# Patient Record
Sex: Male | Born: 1985
Health system: Southern US, Community
[De-identification: ages and names within clinical notes are randomized; demographics above are authoritative.]

## PROBLEM LIST (undated history)

## (undated) DIAGNOSIS — F419 Anxiety disorder, unspecified: Secondary | ICD-10-CM

## (undated) DIAGNOSIS — I471 Supraventricular tachycardia, unspecified: Secondary | ICD-10-CM

## (undated) DIAGNOSIS — F909 Attention-deficit hyperactivity disorder, unspecified type: Secondary | ICD-10-CM

## (undated) DIAGNOSIS — I456 Pre-excitation syndrome: Secondary | ICD-10-CM

## (undated) HISTORY — PX: TONSILLECTOMY: SUR1361

## (undated) HISTORY — PX: CARDIAC CATHETERIZATION: SHX172

## (undated) HISTORY — DX: Anxiety disorder, unspecified: F41.9

## (undated) HISTORY — DX: Attention-deficit hyperactivity disorder, unspecified type: F90.9

---
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
## 2009-09-17 DIAGNOSIS — R6882 Decreased libido: Secondary | ICD-10-CM | POA: Insufficient documentation

## 2010-05-29 ENCOUNTER — Ambulatory Visit: Payer: Self-pay | Admitting: Family Medicine

## 2010-08-23 ENCOUNTER — Emergency Department: Payer: Self-pay | Admitting: *Deleted

## 2012-04-16 ENCOUNTER — Ambulatory Visit: Payer: Self-pay | Admitting: Family Medicine

## 2013-08-07 ENCOUNTER — Ambulatory Visit: Payer: Self-pay | Admitting: Internal Medicine

## 2013-08-31 ENCOUNTER — Emergency Department: Payer: Self-pay | Admitting: Emergency Medicine

## 2013-10-29 DIAGNOSIS — I471 Supraventricular tachycardia: Secondary | ICD-10-CM | POA: Insufficient documentation

## 2013-12-26 DIAGNOSIS — F419 Anxiety disorder, unspecified: Secondary | ICD-10-CM | POA: Insufficient documentation

## 2014-08-25 ENCOUNTER — Other Ambulatory Visit: Payer: Self-pay

## 2014-08-25 ENCOUNTER — Ambulatory Visit: Payer: Self-pay | Admitting: Psychiatry

## 2014-08-25 DIAGNOSIS — F988 Other specified behavioral and emotional disorders with onset usually occurring in childhood and adolescence: Secondary | ICD-10-CM | POA: Insufficient documentation

## 2014-08-25 DIAGNOSIS — G4700 Insomnia, unspecified: Secondary | ICD-10-CM

## 2014-08-25 DIAGNOSIS — Z8709 Personal history of other diseases of the respiratory system: Secondary | ICD-10-CM | POA: Insufficient documentation

## 2014-08-25 DIAGNOSIS — F4001 Agoraphobia with panic disorder: Secondary | ICD-10-CM | POA: Insufficient documentation

## 2014-08-25 DIAGNOSIS — F411 Generalized anxiety disorder: Secondary | ICD-10-CM | POA: Insufficient documentation

## 2014-08-25 NOTE — Telephone Encounter (Signed)
fax was received requesting refill on zolpidem tartrate 10mg  take 0ne (1) at bedtime #90 mail order refill. Pt has appt for 09-02-14 , last seen on 07-23-14

## 2014-08-26 MED ORDER — ZOLPIDEM TARTRATE 10 MG PO TABS: 10.0000 mg | ORAL_TABLET | Freq: Every day | ORAL | Status: DC

## 2014-08-26 NOTE — Telephone Encounter (Signed)
left message that rx was faxed in . pt notified.

## 2014-09-02 ENCOUNTER — Ambulatory Visit: Payer: 59 | Admitting: Psychiatry

## 2014-09-02 DIAGNOSIS — R000 Tachycardia, unspecified: Secondary | ICD-10-CM | POA: Insufficient documentation

## 2014-09-02 DIAGNOSIS — M25519 Pain in unspecified shoulder: Secondary | ICD-10-CM | POA: Insufficient documentation

## 2014-09-02 DIAGNOSIS — K921 Melena: Secondary | ICD-10-CM | POA: Insufficient documentation

## 2014-09-02 DIAGNOSIS — K30 Functional dyspepsia: Secondary | ICD-10-CM | POA: Insufficient documentation

## 2014-09-02 DIAGNOSIS — F410 Panic disorder [episodic paroxysmal anxiety] without agoraphobia: Secondary | ICD-10-CM | POA: Insufficient documentation

## 2014-09-02 DIAGNOSIS — G4700 Insomnia, unspecified: Secondary | ICD-10-CM | POA: Insufficient documentation

## 2014-09-02 DIAGNOSIS — F5221 Male erectile disorder: Secondary | ICD-10-CM | POA: Insufficient documentation

## 2014-09-02 DIAGNOSIS — I456 Pre-excitation syndrome: Secondary | ICD-10-CM | POA: Insufficient documentation

## 2014-09-02 DIAGNOSIS — H9313 Tinnitus, bilateral: Secondary | ICD-10-CM | POA: Insufficient documentation

## 2014-09-02 DIAGNOSIS — M26609 Unspecified temporomandibular joint disorder, unspecified side: Secondary | ICD-10-CM | POA: Insufficient documentation

## 2014-09-04 ENCOUNTER — Ambulatory Visit (INDEPENDENT_AMBULATORY_CARE_PROVIDER_SITE_OTHER): Payer: 59 | Admitting: Psychiatry

## 2014-09-04 ENCOUNTER — Encounter: Payer: Self-pay | Admitting: Psychiatry

## 2014-09-04 VITALS — BP 122/72 | HR 90 | Temp 97.4°F | Ht 70.0 in | Wt 160.0 lb

## 2014-09-04 DIAGNOSIS — F900 Attention-deficit hyperactivity disorder, predominantly inattentive type: Secondary | ICD-10-CM

## 2014-09-04 MED ORDER — DIAZEPAM 10 MG PO TABS: 10.0000 mg | ORAL_TABLET | Freq: Every day | ORAL | Status: DC | PRN

## 2014-09-04 MED ORDER — AMPHETAMINE-DEXTROAMPHETAMINE 30 MG PO TABS: 30.0000 mg | ORAL_TABLET | Freq: Every day | ORAL | Status: DC

## 2014-09-04 NOTE — Progress Notes (Signed)
BH MD/PA/NP OP Progress Note  09/04/2014 2:33 PM Bobby [name]  MRN:  [id number]  Subjective:  Patient returns for follow-up of his panic disorder, ADHD and generalized anxiety disorder. He states that he did get his catheter ablation of his heart. He states he feels much better as he does not worry anymore about his heart suddenly racing. 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   He states that one changes that his cousin and his nephews have moved in with patient and patient's mother. He states it is certainly change the character of the house as they are needy at times.  States the medications continue to work well. He does state that the Adderall XR may be causing him some restlessness in the evening and requests a trial of the regular release form.  Dates there is an upcoming family vacation July 16 where they typically go to an uncles [place]do [place]. He looks forward to this. Chief Complaint:  Chief Complaint    ADD; Anxiety     Visit Diagnosis:  No diagnosis found.  Past Medical History:  Past Medical History  Diagnosis Date  . Anxiety   . ADHD (attention deficit hyperactivity disorder)     Past Surgical History  Procedure Laterality Date  . Tonsillectomy  1994  . Cardiac catheterization    . Tonsillectomy     Family History:  Family History  Problem Relation Age of Onset  . Breast cancer Mother   . Ovarian cancer Mother   . Alcohol abuse Father   . Drug abuse Father   . Heart attack Maternal Grandfather   . Heart attack Paternal Grandfather   . Depression Brother    Social History:  History   Social History  . Marital Status: Single    Spouse Name: N/A  . Number of Children: N/A  . Years of Education: N/A   Social History Main Topics  . Smoking status: Never Smoker   . Smokeless tobacco: Never Used  . Alcohol Use: No     Comment: social  . Drug Use: No  . Sexual Activity: Yes    Birth Control/ Protection: Condom    Other Topics Concern  . None   Social History Narrative   Additional History:   Assessment:   Musculoskeletal: Strength & Muscle Tone: within normal limits Gait & Station: normal Patient leans: N/A  Psychiatric Specialty Exam: HPI  Review of Systems  Psychiatric/Behavioral: Negative for depression, suicidal ideas, hallucinations, memory loss and substance abuse. The patient is not nervous/anxious and does not have insomnia.     Blood pressure 122/72, pulse 90, temperature 97.4 F (36.3 C), temperature source Tympanic, height  (1.778 m), weight 160 lb (72.576 kg), SpO2 95 %.Body mass index is 22.96 kg/(m^2).  General Appearance: Well Groomed  Eye Contact:  Good  Speech:  Clear and Coherent and Normal Rate  Volume:  Normal  Mood:  Good  Affect:  bright, smiling and able to laugh  Thought Process:  Linear and Logical  Orientation:  Full (Time, Place, and Person)  Thought Content:  Negative  Suicidal Thoughts:  No  Homicidal Thoughts:  No  Memory:  Immediate;   Good Recent;   Good Remote;   Good  Judgement:  Good  Insight:  Good  Psychomotor Activity:  Negative  Concentration:  Good  Recall:  Good  Fund of Knowledge: Good  Language: Good  Akathisia:  Negative  Handed:  Rightunknown  AIMS (if indicated):  Not done  Assets:  Communication Skills Social Support Vocational/Educational  ADL's:  Intact  Cognition: WNL  Sleep:  good   Is the patient at risk to self?  No. Has the patient been a risk to self in the past 6 months?  No. Has the patient been a risk to self within the distant past?  No. Is the patient a risk to others?  No. Has the patient been a risk to others in the past 6 months?  No. Has the patient been a risk to others within the distant past?  No.  Current Medications: Current Outpatient Prescriptions  Medication Sig Dispense Refill  . amphetamine-dextroamphetamine (ADDERALL XR) 30 MG 24 hr capsule Take 30 mg by mouth daily.    .  diazepam (VALIUM) 10 MG tablet Take 1 tablet (10 mg total) by mouth daily as needed for anxiety. 30 tablet 1  . sildenafil (REVATIO) 20 MG tablet Take 1-2 tablets by mouth. 1-4 hours prior to intercourse    . zolpidem (AMBIEN) 10 MG tablet Take 1 tablet (10 mg total) by mouth at bedtime. 90 tablet 0  . amphetamine-dextroamphetamine (ADDERALL) 30 MG tablet Take 1 tablet by mouth daily. 30 tablet 0   No current facility-administered medications for this visit.    Medical Decision Making:  Established Problem, Stable/Improving (1), Review of Medication Regimen & Side Effects (2) and Review of New Medication or Change in Dosage (2)  Treatment Plan Summary:Medication management continue patient's medications. We'll continue Valium 10 mg daily as needed. 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 We will change him from the Adderall XR to Adderall 30 mg per his request. He's been encouraged to call with any questions or concerns prior to his next appointment. He's been given a 30 day supply of Adderall and told to call the clinic within a few days prior to him needing another 30 day supply. He has been given the Valium 10 mg, #30 with one refill. She'll follow up in 2 months.   Wallace Goinglton Kariann Wecker 09/04/2014, 2:33 PM

## 2014-09-09 ENCOUNTER — Encounter: Payer: Self-pay | Admitting: Family Medicine

## 2014-09-09 ENCOUNTER — Ambulatory Visit (INDEPENDENT_AMBULATORY_CARE_PROVIDER_SITE_OTHER): Payer: 59 | Admitting: Family Medicine

## 2014-09-09 VITALS — BP 104/62 | HR 90 | Temp 98.1°F | Resp 16 | Ht 71.75 in | Wt 163.6 lb

## 2014-09-09 DIAGNOSIS — F909 Attention-deficit hyperactivity disorder, unspecified type: Secondary | ICD-10-CM

## 2014-09-09 DIAGNOSIS — J0120 Acute ethmoidal sinusitis, unspecified: Secondary | ICD-10-CM

## 2014-09-09 DIAGNOSIS — F410 Panic disorder [episodic paroxysmal anxiety] without agoraphobia: Secondary | ICD-10-CM | POA: Diagnosis not present

## 2014-09-09 DIAGNOSIS — F988 Other specified behavioral and emotional disorders with onset usually occurring in childhood and adolescence: Secondary | ICD-10-CM

## 2014-09-09 DIAGNOSIS — Z0000 Encounter for general adult medical examination without abnormal findings: Secondary | ICD-10-CM

## 2014-09-09 DIAGNOSIS — I471 Supraventricular tachycardia, unspecified: Secondary | ICD-10-CM

## 2014-09-09 DIAGNOSIS — I456 Pre-excitation syndrome: Secondary | ICD-10-CM | POA: Diagnosis not present

## 2014-09-09 MED ORDER — AMOXICILLIN 875 MG PO TABS: 875.0000 mg | ORAL_TABLET | Freq: Two times a day (BID) | ORAL | Status: DC

## 2014-09-09 NOTE — Progress Notes (Signed)
Patient: Bobby [name], Male    DOB: 07/04/1985, 29 y.o.   MRN: [id number] Visit Date: 09/09/2014  Today's Provider: Dortha Kernennis Chrismon, PA   Chief Complaint  Patient presents with  . Annual Exam   Subjective:    Annual physical exam Bobby [name] is a 29 y.o. male who presents today for health maintenance and complete physical. He feels fairly well. He reports exercising has been gradually increased since having cardiac ablation procedure for tachycardia due to WPW syndrome. He reports he is sleeping 5 hours with Ambien. No hangover the next morning. Anxiety improved Also, developed a cough with greenish-yellow sputum and nasal congestion 3 days ago. Denies sore throat, earache or fever. Stared Mucinex and Delsym with improvement in symptoms. Still having some upper and lower gum/teeth soreness. Psychiatrist still treating panic disorder and ADD with Ambien at bedtime, Valium 10 mg qd prn panic and Adderall 30 mg daily. Feeling more focused and less panic attacks. Cardiologist approved use of Adderall. -----------------------------------------------------------------   Review of Systems  Constitutional: Negative for fever and chills.  HENT: Positive for dental problem, rhinorrhea and sinus pressure. Negative for ear pain and sore throat.   Eyes: Negative.   Respiratory: Positive for cough. Negative for wheezing.   Cardiovascular: Negative.   Gastrointestinal: Negative.   Genitourinary: Negative.   Musculoskeletal: Positive for arthralgias.       History of TMJ syndrome on the right.  Psychiatric/Behavioral: Positive for sleep disturbance and agitation. Negative for suicidal ideas. The patient is nervous/anxious.     Social History He  reports that he has never smoked. He has never used smokeless tobacco. He reports that he does not drink alcohol or use illicit drugs.  Patient Active Problem List   Diagnosis Date Noted  . Blood in feces 09/02/2014  . Acid indigestion  09/02/2014  . ED (erectile dysfunction) of non-organic origin 09/02/2014  . Cannot sleep 09/02/2014  . Episodic paroxysmal anxiety disorder 09/02/2014  . Fast heart beat 09/02/2014  . Clicking jaw syndrome 09/02/2014  . Bilateral tinnitus 09/02/2014  . Arthralgia of shoulder 09/02/2014  . Ventricular pre-excitation with arrhythmia 09/02/2014  . History of hay fever 08/25/2014  . ADD (attention deficit disorder) 08/25/2014  . Anxiety, generalized 08/25/2014  . Insomnia, persistent 08/25/2014  . Panic disorder without agoraphobia 08/25/2014  . Anxiety 12/26/2013  . Paroxysmal supraventricular tachycardia 10/29/2013  . Decreased libido 09/17/2009    Past Surgical History  Procedure Laterality Date  . Tonsillectomy  1994  . Cardiac catheterization    . Tonsillectomy      Family History His family history includes Alcohol abuse in his father; Breast cancer in his mother; Depression in his brother; Drug abuse in his father; Heart attack in his maternal grandfather and paternal grandfather; Ovarian cancer in his mother.    Previous Medications   AMPHETAMINE-DEXTROAMPHETAMINE (ADDERALL XR) 30 MG 24 HR CAPSULE    Take 30 mg by mouth daily.   AMPHETAMINE-DEXTROAMPHETAMINE (ADDERALL) 30 MG TABLET    Take 1 tablet by mouth daily.   DIAZEPAM (VALIUM) 10 MG TABLET    Take 1 tablet (10 mg total) by mouth daily as needed for anxiety.   ZOLPIDEM (AMBIEN) 10 MG TABLET    Take 1 tablet (10 mg total) by mouth at bedtime.   Allergies  Allergen Reactions  . Decongestant [Oxymetazoline] Palpitations    Patient states cardiologist cleared him to use decongestants.    Patient Care Team: Tamsen Roersennis E Chrismon, PA as  PCP - General (Physician Assistant)     Objective:   Vitals: BP 104/62 mmHg  Pulse 90  Temp(Src) 98.1 F (36.7 C) (Oral)  Resp 16  Ht 5' 11.75" (1.822 m)  Wt 163 lb 9.6 oz (74.208 kg)  BMI 22.35 kg/m2  SpO2 98%   Physical Exam  Constitutional: He is oriented to person, place,  and time. He appears well-developed and well-nourished.  HENT:  Head: Normocephalic and atraumatic.  Right Ear: External ear normal.  Left Ear: External ear normal.  Nose: Nose normal.  Mouth/Throat: Oropharynx is clear and moist.  Tender ethmoid sinuses to palpate. Good frontal transillumination. Poor maxillary transillumination without tenderness.  Eyes: Conjunctivae and EOM are normal. Pupils are equal, round, and reactive to light.  Neck: Normal range of motion. Neck supple.  Cardiovascular: Normal rate, regular rhythm, normal heart sounds and intact distal pulses.   Pulmonary/Chest: Effort normal and breath sounds normal.  Abdominal: Soft. Bowel sounds are normal.  Well healed small 5 mm scars without signs of hernias or bruising bilateral inguinal region from recent catheterization for cardiac ablation procedure.  Genitourinary: Penis normal.  Neurological: He is alert and oriented to person, place, and time. He has normal reflexes.  Skin: Skin is warm and dry.  Psychiatric: His behavior is normal. Thought content normal.  Appropriate affect. Minimal anxiety today. No suicidal ideation.  Vitals reviewed.  Assessment & Plan:     Routine Health Maintenance and Physical Exam  Exercise Activities and Dietary recommendations Goals    Gradually increase activities according to cardiologist recommendations since having cardiac ablation procedure for tachycardia.       Health Maintenance  Topic Date Due  . HIV Screening  07/13/2002  . TETANUS/TDAP  07/13/2006  . INFLUENZA VACCINE  10/06/2014      Discussed health benefits of physical activity, and encouraged him to engage in regular exercise appropriate for his age and condition.    Ethmoid sinusitis             Recent onset without fever. Having cough and ethmoid pressure discomfort. May continue Mucinex and Delsym prn. Add Amoxicillin for 10 days. Increase fluid intake and recheck prn.    Paroxysmal supraventricular  tachycardia No recurrence since cardiac ablation procedure. Without episodes of unrelenting tachycardia, anxiety and panic greatly improved.   WPW (Wolff-Parkinson-White syndrome) Stable. Follow up with cardiologist as planned.   Panic disorder Well controlled. Continue follow up with psychiatrist (Dr. [name]) and follow his medication regimen.   ADD (attention deficit disorder) Improved. Followed by psychiatrist (Dr. [name]).  ----------------------------------------------------------

## 2014-09-24 ENCOUNTER — Other Ambulatory Visit: Payer: Self-pay

## 2014-09-24 DIAGNOSIS — F411 Generalized anxiety disorder: Secondary | ICD-10-CM

## 2014-09-24 DIAGNOSIS — F988 Other specified behavioral and emotional disorders with onset usually occurring in childhood and adolescence: Secondary | ICD-10-CM

## 2014-09-24 MED ORDER — AMPHETAMINE-DEXTROAMPHETAMINE 30 MG PO TABS: 30.0000 mg | ORAL_TABLET | Freq: Every day | ORAL | Status: DC

## 2014-09-24 NOTE — Telephone Encounter (Signed)
Per [facility] the pt does have another refill left on valium but pt can not get until 09-25-14

## 2014-09-24 NOTE — Telephone Encounter (Signed)
pt called pt needs refill on two medications.  adderall   and valium  pt uses [facility].

## 2014-09-24 NOTE — Telephone Encounter (Signed)
spoke with pt . told pt that rx for adderall was ready to be picked up at our front desk and that pt did have another refillon [facility]ium at [facility] but that [facility] said it could not be refilled until 09-25-14

## 2014-10-30 ENCOUNTER — Ambulatory Visit (INDEPENDENT_AMBULATORY_CARE_PROVIDER_SITE_OTHER): Payer: 59 | Admitting: Psychiatry

## 2014-10-30 ENCOUNTER — Encounter: Payer: Self-pay | Admitting: Psychiatry

## 2014-10-30 VITALS — BP 113/74 | HR 94 | Resp 12 | Ht 70.0 in

## 2014-10-30 DIAGNOSIS — G4700 Insomnia, unspecified: Secondary | ICD-10-CM

## 2014-10-30 DIAGNOSIS — F411 Generalized anxiety disorder: Secondary | ICD-10-CM | POA: Diagnosis not present

## 2014-10-30 MED ORDER — AMPHETAMINE-DEXTROAMPHETAMINE 30 MG PO TABS: 30.0000 mg | ORAL_TABLET | Freq: Every day | ORAL | Status: DC

## 2014-10-30 MED ORDER — ZOLPIDEM TARTRATE 10 MG PO TABS: 10.0000 mg | ORAL_TABLET | Freq: Every day | ORAL | Status: DC

## 2014-10-30 MED ORDER — DIAZEPAM 10 MG PO TABS: 10.0000 mg | ORAL_TABLET | Freq: Every day | ORAL | Status: DC | PRN

## 2014-10-30 NOTE — Progress Notes (Addendum)
BH MD/PA/NP OP Progress Note  10/30/2014 2:13 PM Bobby [name]  MRN:  [id number]  Subjective:  Patient returns for follow-up of his panic disorder, ADHD and generalized anxiety disorder. Patient states overall he is doing well on the medications are helping him. The biggest stressor right now is that his sister has on to [place] for family time "with her father." He states that during this time she has left her one in 27-year-old with patient and patient's mother. She states that her mother takes care of him during the day when he is at work but then when he comes home from work his mother works third shift and he cares for the kids until she gets home around 10 PM. He states that it's exhausting as they are very demanding and requires a lot of care. He states also at job is been more stressful because they're down several people in the work load is heavy and he is heard it might be increasing later this fall.  Overall he states that when his mother gets and he does rest. He states he gets some time on Saturday mornings to himself. He states that he takes his niece and nephew with him to visit his grandmother on Sundays. Chief Complaint: Stress  Visit Diagnosis:     ICD-9-CM ICD-10-CM   1. Generalized anxiety disorder 300.02 F41.1 zolpidem (AMBIEN) 10 MG tablet  2. Insomnia, persistent 307.42 G47.00 zolpidem (AMBIEN) 10 MG tablet    Past Medical History:  Past Medical History  Diagnosis Date  . Anxiety   . ADHD (attention deficit hyperactivity disorder)     Past Surgical History  Procedure Laterality Date  . Tonsillectomy  1994  . Cardiac catheterization    . Tonsillectomy     Family History:  Family History  Problem Relation Age of Onset  . Breast cancer Mother   . Ovarian cancer Mother   . Alcohol abuse Father   . Drug abuse Father   . Heart attack Maternal Grandfather   . Heart attack Paternal Grandfather   . Depression Brother    Social History:  Social History   Social  History  . Marital Status: Single    Spouse Name: N/A  . Number of Children: N/A  . Years of Education: N/A   Social History Main Topics  . Smoking status: Never Smoker   . Smokeless tobacco: Never Used  . Alcohol Use: No     Comment: social  . Drug Use: No  . Sexual Activity: Yes    Birth Control/ Protection: Condom   Other Topics Concern  . Not on file   Social History Narrative   Additional History:   Assessment:   Musculoskeletal: Strength & Muscle Tone: within normal limits Gait & Station: normal Patient leans: N/A  Psychiatric Specialty Exam: HPI  Review of Systems  Psychiatric/Behavioral: Negative for depression, suicidal ideas, hallucinations, memory loss and substance abuse. The patient is not nervous/anxious and does not have insomnia.     There were no vitals taken for this visit.There is no weight on file to calculate BMI.  General Appearance: Well Groomed  Eye Contact:  Good  Speech:  Clear and Coherent and Normal Rate  Volume:  Normal  Mood:  Good  Affect:  bright, smiling and able to laugh  Thought Process:  Linear and Logical  Orientation:  Full (Time, Place, and Person)  Thought Content:  Negative  Suicidal Thoughts:  No  Homicidal Thoughts:  No  Memory:  Immediate;  Good Recent;   Good Remote;   Good  Judgement:  Good  Insight:  Good  Psychomotor Activity:  Negative  Concentration:  Good  Recall:  Good  Fund of Knowledge: Good  Language: Good  Akathisia:  Negative  Handed:  Rightunknown  AIMS (if indicated):  Not done  Assets:  Communication Skills Social Support Vocational/Educational  ADL's:  Intact  Cognition: WNL  Sleep:  good   Is the patient at risk to self?  No. Has the patient been a risk to self in the past 6 months?  No. Has the patient been a risk to self within the distant past?  No. Is the patient a risk to others?  No. Has the patient been a risk to others in the past 6 months?  No. Has the patient been a risk to  others within the distant past?  No.  Current Medications: Current Outpatient Prescriptions  Medication Sig Dispense Refill  . amoxicillin (AMOXIL) 875 MG tablet Take 1 tablet (875 mg total) by mouth 2 (two) times daily. 20 tablet 0  . amphetamine-dextroamphetamine (ADDERALL XR) 30 MG 24 hr capsule Take 30 mg by mouth daily.    Marland Kitchen amphetamine-dextroamphetamine (ADDERALL) 30 MG tablet Take 1 tablet by mouth daily. 30 tablet 0  . amphetamine-dextroamphetamine (ADDERALL) 30 MG tablet Take 1 tablet by mouth daily. 30 tablet 0  . diazepam (VALIUM) 10 MG tablet Take 1 tablet (10 mg total) by mouth daily as needed for anxiety. 30 tablet 3  . zolpidem (AMBIEN) 10 MG tablet Take 1 tablet (10 mg total) by mouth at bedtime. 90 tablet 0   No current facility-administered medications for this visit.    Medical Decision Making:  Established Problem, Stable/Improving (1), Review of Medication Regimen & Side Effects (2) and Review of New Medication or Change in Dosage (2)  Treatment Plan Summary:Medication management continue patient's medications. We'll continue Valium 10 mg daily as needed. Continue Ambien 10 mg at bedtime,  Adderall 30 mg QD. He will follow-up in 3 months. He's been encouraged call with any questions or concerns prior to his next appointment. They was given a prescription for his Adderall. We did send in another 90 day order for his Ambien to his mail order service. He was given a prescription for his Valium, 30 day supply with 3 refills.  12/31/14 15:50 Received a call from the patient's pharmacy. The pharmacy informed this office that patient was also getting a prescription from Adderall by another doctor, Dr. Boone Master. At this point we have informed the pharmacy not to fill the Adderall prescription from this writer. I have also contacted his [facility] and canceled the prescription for Ambien. Patient will be instructed by his pharmacy to contact this office for further  disposition.   [name] 10/30/2014, 2:13 PM

## 2014-10-31 ENCOUNTER — Telehealth: Payer: Self-pay | Admitting: Psychiatry

## 2014-10-31 NOTE — Telephone Encounter (Signed)
called notified that pt has been getting adderal  at [facility] and in [facility]. pt been getting from a Dr. Loma Sender  single practic dr. in [place] phone number is [phone]. pharmacy states that they have left a message with dr. Leonette Most but there office is closed for lunch so he hopes he call back

## 2014-10-31 NOTE — Telephone Encounter (Signed)
pt called wanted to find out why the rx could not be filled. pt was told that we were notified that he already had received adderrall rx for  from dr. Leonette Most phillips and that we could send in another rx.

## 2014-11-03 NOTE — Telephone Encounter (Signed)
error on 10-31-14  4:23pm message:  should state we would not send in another rx.

## 2014-11-03 NOTE — Telephone Encounter (Signed)
Given this behavior we have instructed the pharmacy not to fill my prescription for Adderall. Writer was unaware patient was getting a prescription from another clinician. I've also contact his pharmacy and regards to his mail order prescription for Ambien and cancel this. As patient may be getting multiple prescriptions from other providers will have to discuss an appropriate disposition with him. AW

## 2014-12-01 ENCOUNTER — Telehealth: Payer: Self-pay

## 2014-12-01 NOTE — Telephone Encounter (Signed)
received a fax requesting a refill on zolpidem.  this patient was sent a dismissal letter out on sept 21st.

## 2014-12-01 NOTE — Telephone Encounter (Signed)
Patient's pharmacy provided information that patient had been receiving prescriptions for Adderall from another physician. This was occurring during the time that this writer has been providing prescriptions. As Psychologist, prison and probation services will not continue to provide prescriptions for Adderall or zolpidem, both controlled substances. If patient has been misusing medicine by obtaining it from 2 providers issue no medication would not be appropriate. Asian is already been mailed a dismissal led her as Clinical research associate explained at the beginning of treatment with him issues regarding this misuse of stimulants or benzodiazepines, benzodiazepine-like substances would result in the discontinuing the medication. AW

## 2014-12-09 ENCOUNTER — Other Ambulatory Visit: Payer: Self-pay

## 2014-12-12 ENCOUNTER — Ambulatory Visit (INDEPENDENT_AMBULATORY_CARE_PROVIDER_SITE_OTHER): Payer: 59 | Admitting: Family Medicine

## 2014-12-12 ENCOUNTER — Encounter: Payer: Self-pay | Admitting: Family Medicine

## 2014-12-12 VITALS — BP 98/64 | HR 87 | Temp 98.3°F | Resp 16 | Wt 164.2 lb

## 2014-12-12 DIAGNOSIS — F410 Panic disorder [episodic paroxysmal anxiety] without agoraphobia: Secondary | ICD-10-CM

## 2014-12-12 DIAGNOSIS — F411 Generalized anxiety disorder: Secondary | ICD-10-CM | POA: Diagnosis not present

## 2014-12-12 DIAGNOSIS — G4700 Insomnia, unspecified: Secondary | ICD-10-CM | POA: Diagnosis not present

## 2014-12-12 MED ORDER — ZOLPIDEM TARTRATE 10 MG PO TABS
10.0000 mg | ORAL_TABLET | Freq: Every day | ORAL | Status: DC
Start: 2014-12-12 — End: 2014-12-12

## 2014-12-12 MED ORDER — ZOLPIDEM TARTRATE 10 MG PO TABS: 10.0000 mg | ORAL_TABLET | Freq: Every day | ORAL | Status: DC

## 2014-12-12 NOTE — Progress Notes (Signed)
Patient ID: Bobby [name], male   DOB: 1985/08/08, 29 y.o.   MRN: [id number]   Chief Complaint  Patient presents with  . Medication Refill    Ambien and Diazepam     Subjective:  HPI Patient is requesting refills on the following medications: Ambien 10 mg tablet and Diazepam 10 mg tablet. Has been released from psychiatrist due to missing so many appointments and has decided he does not want to take the Adderall because it kept him awake at night. Uses the Ambien each night at 9:00pm and get up at 7:30 am during the work week. Uses the Diazepam for acute anxiety and panic attacks (has used 15 tablets in the past month). Anxiety usually occurs when he goes out to eat with friends or goes to see his grandmother in [place] that is declining from Alzheimer's (not eating much or walking any now). Also, helping care for cousin's two youngest children (1 and 2 year olds) since the mother has been a drug addict and the father will get the children back Dec.14th.   Prior to Admission medications   Medication Sig Start Date End Date Taking? Authorizing Provider  diazepam (VALIUM) 10 MG tablet Take 1 tablet (10 mg total) by mouth daily as needed for anxiety. 10/30/14  Yes Kerin Salen, MD  zolpidem (AMBIEN) 10 MG tablet Take 1 tablet (10 mg total) by mouth at bedtime. 10/30/14  Yes Kerin Salen, MD   Patient Active Problem List   Diagnosis Date Noted  . WPW (Wolff-Parkinson-White syndrome) 09/09/2014  . Blood in feces 09/02/2014  . Acid indigestion 09/02/2014  . ED (erectile dysfunction) of non-organic origin 09/02/2014  . Cannot sleep 09/02/2014  . Episodic paroxysmal anxiety disorder 09/02/2014  . Fast heart beat 09/02/2014  . Clicking jaw syndrome 09/02/2014  . Bilateral tinnitus 09/02/2014  . Arthralgia of shoulder 09/02/2014  . Ventricular pre-excitation with arrhythmia 09/02/2014  . History of hay fever 08/25/2014  . ADD (attention deficit disorder) 08/25/2014  . Anxiety,  generalized 08/25/2014  . Insomnia, persistent 08/25/2014  . Panic disorder without agoraphobia 08/25/2014  . Anxiety 12/26/2013  . Paroxysmal supraventricular tachycardia (HCC) 10/29/2013  . Decreased libido 09/17/2009   Past Medical History  Diagnosis Date  . Anxiety   . ADHD (attention deficit hyperactivity disorder)     Social History   Social History  . Marital Status: Single    Spouse Name: N/A  . Number of Children: N/A  . Years of Education: N/A   Occupational History  . Not on file.   Social History Main Topics  . Smoking status: Never Smoker   . Smokeless tobacco: Never Used  . Alcohol Use: No     Comment: social  . Drug Use: No  . Sexual Activity: Yes    Birth Control/ Protection: Condom   Other Topics Concern  . Not on file   Social History Narrative   Allergies  Allergen Reactions  . Decongestant [Oxymetazoline] Palpitations    Patient states cardiologist cleared him to use decongestants.   Review of Systems  Constitutional: Negative.   HENT: Negative.   Eyes: Negative.   Respiratory: Negative.   Cardiovascular: Negative.   Gastrointestinal: Negative.   Genitourinary: Negative.   Musculoskeletal: Negative.   Skin: Negative.   Neurological: Negative.   Endo/Heme/Allergies: Negative.   Psychiatric/Behavioral: The patient is nervous/anxious.     There is no immunization history on file for this patient. Objective:  BP 98/64 mmHg  Pulse 87  Temp(Src)  98.3 F (36.8 C) (Oral)  Resp 16  Wt 164 lb 3.2 oz (74.481 kg)  SpO2 97% Wt Readings from Last 3 Encounters:  12/12/14 164 lb 3.2 oz (74.481 kg)  09/09/14 163 lb 9.6 oz (74.208 kg)  09/04/14 160 lb (72.576 kg)    Physical Exam  Constitutional: He is oriented to person, place, and time and well-developed, well-nourished, and in no distress.  HENT:  Head: Normocephalic.  Eyes: Conjunctivae are normal.  Neck: Neck supple.  Cardiovascular: Normal rate, regular rhythm and normal heart  sounds.   Pulmonary/Chest: Effort normal and breath sounds normal.  Musculoskeletal: Normal range of motion.  Neurological: He is alert and oriented to person, place, and time.  Skin: No rash noted.  Psychiatric: Memory normal. His mood appears anxious. He has a flat affect.    Assessment and Plan :   1. Panic disorder without agoraphobia Acute attacks usually happen after eating. Will recheck labs for metabolic issues. May use Valium 10 mg once a day prn only (patient did not realize he still has refills available at the pharmacy). Recheck in 3 months. - COMPLETE METABOLIC PANEL WITH GFR - CBC with Differential/Platelet - TSH  2. Generalized anxiety disorder Feels better control with sleeping 7-9 hours a night and being off the Adderall. Reminded patient he must not seek refills of these medications from other providers. Recheck in 3 months. If symptoms escalating, will need to refer to another psychiatrist.  3. Insomnia, persistent Very good response from Ambien without hangover symptoms/sleepiness in the mornings. May not use it on the weekends if he doesn't have to get up to go to work. Will write a refill for him to send to [facility]. Recheck in 3 months. - zolpidem (AMBIEN) 10 MG tablet; Take 1 tablet (10 mg total) by mouth at bedtime.  Dispense: 90 tablet; Refill: 0   Dortha Kern [place] [facility] 12/12/2014 8:42 AM

## 2014-12-15 ENCOUNTER — Other Ambulatory Visit: Payer: Self-pay

## 2014-12-15 DIAGNOSIS — G4700 Insomnia, unspecified: Secondary | ICD-10-CM

## 2014-12-15 MED ORDER — ZOLPIDEM TARTRATE 10 MG PO TABS: 10.0000 mg | ORAL_TABLET | Freq: Every day | ORAL | Status: DC

## 2014-12-15 NOTE — Telephone Encounter (Signed)
Per Maurine Minister Chrismon Ambien 10 mg #30 called in at [facility]. Patient has a printed RX for Ambien 10 mg #90 that he will send to [facility]. Patient was given RX at his OV on 12/12/2014.

## 2014-12-19 ENCOUNTER — Telehealth: Payer: Self-pay

## 2014-12-19 LAB — CBC WITH DIFFERENTIAL/PLATELET
BASOS ABS: 0 10*3/uL (ref 0.0–0.2)
Basos: 0 %
EOS (ABSOLUTE): 0.1 10*3/uL (ref 0.0–0.4)
Eos: 1 %
HEMATOCRIT: 44 % (ref 37.5–51.0)
HEMOGLOBIN: 15.5 g/dL (ref 12.6–17.7)
Immature Grans (Abs): 0.1 10*3/uL (ref 0.0–0.1)
Immature Granulocytes: 1 %
LYMPHS ABS: 2.7 10*3/uL (ref 0.7–3.1)
Lymphs: 32 %
MCH: 30.9 pg (ref 26.6–33.0)
MCHC: 35.2 g/dL (ref 31.5–35.7)
MCV: 88 fL (ref 79–97)
MONOCYTES: 6 %
MONOS ABS: 0.5 10*3/uL (ref 0.1–0.9)
NEUTROS ABS: 4.9 10*3/uL (ref 1.4–7.0)
Neutrophils: 60 %
Platelets: 289 10*3/uL (ref 150–379)
RBC: 5.01 x10E6/uL (ref 4.14–5.80)
RDW: 13 % (ref 12.3–15.4)
WBC: 8.3 10*3/uL (ref 3.4–10.8)

## 2014-12-19 LAB — COMPREHENSIVE METABOLIC PANEL
A/G RATIO: 2 (ref 1.1–2.5)
ALBUMIN: 4.7 g/dL (ref 3.5–5.5)
ALT: 49 IU/L — ABNORMAL HIGH (ref 0–44)
AST: 26 IU/L (ref 0–40)
Alkaline Phosphatase: 83 IU/L (ref 39–117)
BILIRUBIN TOTAL: 1 mg/dL (ref 0.0–1.2)
BUN / CREAT RATIO: 13 (ref 8–19)
BUN: 11 mg/dL (ref 6–20)
CHLORIDE: 101 mmol/L (ref 97–108)
CO2: 26 mmol/L (ref 18–29)
Calcium: 9.7 mg/dL (ref 8.7–10.2)
Creatinine, Ser: 0.85 mg/dL (ref 0.76–1.27)
GFR calc non Af Amer: 119 mL/min/{1.73_m2} (ref >59)
GFR, EST AFRICAN AMERICAN: 138 mL/min/{1.73_m2} (ref >59)
Globulin, Total: 2.3 g/dL (ref 1.5–4.5)
Glucose: 76 mg/dL (ref 65–99)
POTASSIUM: 3.9 mmol/L (ref 3.5–5.2)
Sodium: 143 mmol/L (ref 134–144)
TOTAL PROTEIN: 7 g/dL (ref 6.0–8.5)

## 2014-12-19 LAB — TSH: TSH: 2.26 u[IU]/mL (ref 0.450–4.500)

## 2014-12-19 NOTE — Telephone Encounter (Signed)
Patient advised as directed below. Patient verbalized understanding.  

## 2014-12-19 NOTE — Telephone Encounter (Signed)
-----   Message from Jodell Ciproennis E Uriahhrismon, [place] sent at 12/19/2014 10:59 AM EDT ----- All blood tests normal. No sign of diabetes, thyroid issues, anemia or electrolyte imbalances. Proceed with present medications and follow up in 2-3 months.

## 2015-01-27 ENCOUNTER — Ambulatory Visit: Payer: Self-pay | Admitting: Psychiatry

## 2015-02-02 NOTE — Progress Notes (Signed)
This patient has been dismissed from our clinic. Pt was getting medication from two different doctors.

## 2015-02-02 NOTE — Progress Notes (Signed)
This patient has been dismissed from our clinic. Pt was getting medication from two different doctors. 

## 2015-03-12 ENCOUNTER — Emergency Department: Payer: 59

## 2015-03-12 ENCOUNTER — Encounter: Payer: Self-pay | Admitting: Emergency Medicine

## 2015-03-12 ENCOUNTER — Emergency Department
Admission: EM | Admit: 2015-03-12 | Discharge: 2015-03-12 | Disposition: A | Discharge status: Home / Self Care | Payer: 59 | Attending: Emergency Medicine | Admitting: Emergency Medicine

## 2015-03-12 DIAGNOSIS — Z79899 Other long term (current) drug therapy: Secondary | ICD-10-CM | POA: Insufficient documentation

## 2015-03-12 DIAGNOSIS — R0600 Dyspnea, unspecified: Secondary | ICD-10-CM | POA: Insufficient documentation

## 2015-03-12 DIAGNOSIS — R0602 Shortness of breath: Secondary | ICD-10-CM | POA: Diagnosis present

## 2015-03-12 HISTORY — DX: Supraventricular tachycardia: I47.1

## 2015-03-12 HISTORY — DX: Supraventricular tachycardia, unspecified: I47.10

## 2015-03-12 HISTORY — DX: Pre-excitation syndrome: I45.6

## 2015-03-12 NOTE — Discharge Instructions (Signed)
As we discussed please call your cardiologist tomorrow to arrange a follow-up appointment for Holter monitor testing. Return to the emergency department for any worsening symptoms, or any chest pain. Please also follow-up with her primary care physician so they are aware of your continued symptoms.   Shortness of Breath Shortness of breath means you have trouble breathing. Shortness of breath needs medical care right away. HOME CARE   Do not smoke.  Avoid being around chemicals or things (paint fumes, dust) that may bother your breathing.  Rest as needed. Slowly begin your normal activities.  Only take medicines as told by your doctor.  Keep all doctor visits as told. GET HELP RIGHT AWAY IF:   Your shortness of breath gets worse.  You feel lightheaded, pass out (faint), or have a cough that is not helped by medicine.  You cough up blood.  You have pain with breathing.  You have pain in your chest, arms, shoulders, or belly (abdomen).  You have a fever.  You cannot walk up stairs or exercise the way you normally do.  You do not get better in the time expected.  You have a hard time doing normal activities even with rest.  You have problems with your medicines.  You have any new symptoms. MAKE SURE YOU:  Understand these instructions.  Will watch your condition.  Will get help right away if you are not doing well or get worse.   This information is not intended to replace advice given to you by your health care provider. Make sure you discuss any questions you have with your health care provider.   Document Released: 08/10/2007 Document Revised: 02/26/2013 Document Reviewed: 05/09/2011 Elsevier Interactive Patient Education [employer]2016 Elsevier Inc.

## 2015-03-12 NOTE — ED Notes (Signed)
C/o SHOB last 2 months but worse over last 2 weeks.  Has seen pcp.  Pt has anxiety and pcp feels this has been anxiety.  Pt feels it is different.  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

## 2015-03-12 NOTE — ED Notes (Addendum)
Pt reports taking valium PRN for anxiety and that pt has had to increase dosage recently due to busy time at work. Pt reports SOB increases after eating.

## 2015-03-12 NOTE — ED Provider Notes (Signed)
[facility] Emergency Department Provider Note  Time seen: 4:27 PM  I have reviewed the triage vital signs and the nursing notes.   HISTORY  Chief Complaint Shortness of Breath    HPI Bobby [name] is a 30 y.o. male the past medical history of anxiety, Wolff-Parkinson-White, SVT status post ablation, who presents to the emergency department with intermittent shortness of breath over the past 2 months. According to the patient for the past 2 months he has had intermittent episodes of shortness of breath. He has seen his primary care physician and was told that it could possibly be related to his underlying anxiety. Patient sees a psychiatrist to display some on Valium for his anxiety attacks. Patient states the shortness of breath episodes seemed to only occur at work, and do not occur when he is off work. States he has a quite stressful job recently. He states for the past 2 weeks the shortness breath episodes of been happening almost every day. Sometimes for several minutes, sometimes up to an hour and then resolved. Denies any chest pain now or at any time. Denies any leg pain or swelling.     Past Medical History  Diagnosis Date  . Anxiety   . ADHD (attention deficit hyperactivity disorder)   . WPW (Wolff-Parkinson-White syndrome)     resolved since ablation  . SVT (supraventricular tachycardia) (HCC)     resolved since ablation    Patient Active Problem List   Diagnosis Date Noted  . WPW (Wolff-Parkinson-White syndrome) 09/09/2014  . Blood in feces 09/02/2014  . Acid indigestion 09/02/2014  . ED (erectile dysfunction) of non-organic origin 09/02/2014  . Cannot sleep 09/02/2014  . Episodic paroxysmal anxiety disorder 09/02/2014  . Fast heart beat 09/02/2014  . Clicking jaw syndrome 09/02/2014  . Bilateral tinnitus 09/02/2014  . Arthralgia of shoulder 09/02/2014  . Ventricular pre-excitation with arrhythmia 09/02/2014  . History of hay fever  08/25/2014  . ADD (attention deficit disorder) 08/25/2014  . Anxiety, generalized 08/25/2014  . Insomnia, persistent 08/25/2014  . Panic disorder without agoraphobia 08/25/2014  . Anxiety 12/26/2013  . Paroxysmal supraventricular tachycardia (HCC) 10/29/2013  . Decreased libido 09/17/2009    Past Surgical History  Procedure Laterality Date  . Tonsillectomy  1994  . Cardiac catheterization    . Tonsillectomy      Current Outpatient Rx  Name  Route  Sig  Dispense  Refill  . diazepam (VALIUM) 10 MG tablet   Oral   Take 1 tablet (10 mg total) by mouth daily as needed for anxiety.   30 tablet   3   . zolpidem (AMBIEN) 10 MG tablet   Oral   Take 1 tablet (10 mg total) by mouth at bedtime.   90 tablet   0     Patient will send RX to [facility]     Allergies Decongestant  Family History  Problem Relation Age of Onset  . Breast cancer Mother   . Ovarian cancer Mother   . Alcohol abuse Father   . Drug abuse Father   . Heart attack Maternal Grandfather   . Heart attack Paternal Grandfather   . Depression Brother     Social History Social History  Substance Use Topics  . Smoking status: Never Smoker   . Smokeless tobacco: Never Used  . Alcohol Use: No     Comment: social    Review of Systems Constitutional: Negative for fever. Cardiovascular: Negative for chest pain. Respiratory: Positive for intermittent  shortness of breath. Gastrointestinal: Negative for abdominal pain, vomiting and diarrhea Neurological: Negative for headache 10-point ROS otherwise negative.  ____________________________________________   PHYSICAL EXAM:  VITAL SIGNS: ED Triage Vitals  Enc Vitals Group     BP 03/12/15 1510 108/71 mmHg     Pulse Rate 03/12/15 1510 90     Resp 03/12/15 1510 16     Temp 03/12/15 1510 98.1 F (36.7 C)     Temp Source 03/12/15 1510 Oral     SpO2 03/12/15 1510 99 %     Weight 03/12/15 1510 162 lb (73.483 kg)     Height 03/12/15 1510 5\' 10"  (1.778 m)      Head Cir --      Peak Flow --      Pain Score --      Pain Loc --      Pain Edu? --      Excl. in GC? --     Constitutional: Alert and oriented. Well appearing and in no distress. Eyes: Normal exam ENT   Head: Normocephalic and atraumatic   Mouth/Throat: Mucous membranes are moist. Cardiovascular: Normal rate, regular rhythm. No murmur Respiratory: Normal respiratory effort without tachypnea nor retractions. Breath sounds are clear and equal bilaterally. No wheezes/rales/rhonchi. Gastrointestinal: Soft and nontender. No distention.  Musculoskeletal: Nontender with normal range of motion in all extremities. No lower extremity tenderness or edema. Neurologic:  Normal speech and language. No gross focal neurologic deficits Skin:  Skin is warm, dry and intact.  Psychiatric: Mood and affect are normal. Speech and behavior are normal.   ____________________________________________    EKG  EKG shows normal sinus rhythm at 81 bpm, narrow QRS, normal axis, normal intervals, RSR pattern most consistent with right bundle-branch block. No ST changes.  ____________________________________________    RADIOLOGY  Chest x-ray shows no acute abnormality   INITIAL IMPRESSION / ASSESSMENT AND PLAN / ED COURSE  Pertinent labs & imaging results that were available during my care of the patient were reviewed by me and considered in my medical decision making (see chart for details).  Patient presents for intermittent shortness breath over the past 2 weeks. Patient has underlying anxiety with panic attack disorder, prescribed Valium by his psychiatrist. Patient denies any shortness breath currently. Denies any chest pain now or at any time. I discussed with the patient given his history of Parkinson White and SVT, even know the patient has had an ablation in May of this year, it would be helpful for him to undergo Holter monitor testing to ensure no arrhythmias precipitating his shortness of  breath episodes. Patient states [facility], and he will call them tomorrow to arrange a Holter monitor test. Patient states his primary care doctor did lab work several weeks ago which was all normal. We will discharge the patient home with cardiology follow-up.  ____________________________________________   FINAL CLINICAL IMPRESSION(S) / ED DIAGNOSES  Dyspnea   Minna AntisKevin Lyniah Fujita, MD 03/12/15 [phone]

## 2015-03-13 ENCOUNTER — Other Ambulatory Visit: Payer: Self-pay

## 2015-03-17 ENCOUNTER — Ambulatory Visit: Payer: Self-pay | Admitting: Family Medicine

## 2015-03-30 ENCOUNTER — Ambulatory Visit (INDEPENDENT_AMBULATORY_CARE_PROVIDER_SITE_OTHER): Payer: 59 | Admitting: Family Medicine

## 2015-03-30 ENCOUNTER — Encounter: Payer: Self-pay | Admitting: Family Medicine

## 2015-03-30 VITALS — BP 102/64 | HR 64 | Temp 98.1°F | Resp 14 | Wt 171.0 lb

## 2015-03-30 DIAGNOSIS — G4700 Insomnia, unspecified: Secondary | ICD-10-CM | POA: Diagnosis not present

## 2015-03-30 DIAGNOSIS — F410 Panic disorder [episodic paroxysmal anxiety] without agoraphobia: Secondary | ICD-10-CM

## 2015-03-30 MED ORDER — ZOLPIDEM TARTRATE 10 MG PO TABS: 10.0000 mg | ORAL_TABLET | Freq: Every day | ORAL | Status: DC

## 2015-03-30 NOTE — Progress Notes (Signed)
Patient ID: [name], male   DOB: Feb 13, 1986, 30 y.o.   MRN: [id number]   Patient: Bobby [name] Male    DOB: 1986-02-11   30 y.o.   MRN: [id number] Visit Date: 03/30/2015  Today's Provider: Dortha Kern, PA   Chief Complaint  Patient presents with  . Anxiety  . Insomnia  . Follow-up   Subjective:    Anxiety Presents for follow-up visit. Onset was more than 5 years ago. The problem has been waxing and waning. Symptoms include depressed mood, insomnia, nervous/anxious behavior, panic and shortness of breath. Primary symptoms comment: Worse when going out to eat with friends.. Symptoms occur most days. The most recent episode lasted 30 minutes (after taking Valium). The severity of symptoms is moderate. The symptoms are aggravated by social activities (going out to eat). The patient sleeps 8 hours (with use of Ambien) per night. The quality of sleep is good.   Past treatments include benzodiazephines. The treatment provided significant relief. Compliance with prior treatments has been good. Compliance with medications: pt reports only taking medications if he goes out to eat with friends.  Insomnia Primary symptoms: sleep disturbance.  The current episode started more than one year. The problem occurs nightly. The problem has been gradually improving since onset. The symptoms are aggravated by anxiety. How many beverages per day that contain caffeine: 0 - 1.  Types of beverages you drink: soda. Past treatments include medication. Improvement on treatment: Patient states he has not taken medication for the past few nights due to being unable to get refill through mail order pharmacy.  Typical bedtime:  10-11 P.M..  How long after going to bed to you fall asleep: 15-30 minutes (with use of Ambien).     Patient Active Problem List   Diagnosis Date Noted  . WPW (Wolff-Parkinson-White syndrome) 09/09/2014  . Blood in feces 09/02/2014  . Acid indigestion 09/02/2014  . ED (erectile  dysfunction) of non-organic origin 09/02/2014  . Cannot sleep 09/02/2014  . Episodic paroxysmal anxiety disorder 09/02/2014  . Fast heart beat 09/02/2014  . Clicking jaw syndrome 09/02/2014  . Bilateral tinnitus 09/02/2014  . Arthralgia of shoulder 09/02/2014  . Ventricular pre-excitation with arrhythmia 09/02/2014  . History of hay fever 08/25/2014  . ADD (attention deficit disorder) 08/25/2014  . Anxiety, generalized 08/25/2014  . Insomnia, persistent 08/25/2014  . Panic disorder without agoraphobia 08/25/2014  . Anxiety 12/26/2013  . Paroxysmal supraventricular tachycardia (HCC) 10/29/2013  . Decreased libido 09/17/2009   Past Surgical History  Procedure Laterality Date  . Tonsillectomy  1994  . Cardiac catheterization    . Tonsillectomy     Family History  Problem Relation Age of Onset  . Breast cancer Mother   . Ovarian cancer Mother   . Alcohol abuse Father   . Drug abuse Father   . Heart attack Maternal Grandfather   . Heart attack Paternal Grandfather   . Depression Brother    Allergies  Allergen Reactions  . Brompheniramine Maleate Palpitations    Patient states cardiologist cleared him to use decongestants.  Leilani Merl [Oxymetazoline] Palpitations    Patient states cardiologist cleared him to use decongestants.      Previous Medications   DIAZEPAM (VALIUM) 10 MG TABLET    Take 1 tablet (10 mg total) by mouth daily as needed for anxiety.   ZOLPIDEM (AMBIEN) 10 MG TABLET    Take 1 tablet (10 mg total) by mouth at bedtime.    Review of Systems  Constitutional: Negative.  HENT: Negative.   Eyes: Negative.   Respiratory: Positive for shortness of breath.   Cardiovascular: Negative.   Gastrointestinal: Negative.   Endocrine: Negative.   Genitourinary: Negative.   Musculoskeletal: Negative.   Skin: Negative.   Allergic/Immunologic: Negative.   Neurological: Negative.   Hematological: Negative.   Psychiatric/Behavioral: Positive for sleep  disturbance. The patient is nervous/anxious and has insomnia.     Social History  Substance Use Topics  . Smoking status: Never Smoker   . Smokeless tobacco: Never Used  . Alcohol Use: No     Comment: social   Objective:   BP 102/64 mmHg  Pulse 64  Temp(Src) 98.1 F (36.7 C) (Oral)  Resp 14  Wt 171 lb (77.565 kg)  Physical Exam  Constitutional: He is oriented to person, place, and time. He appears well-developed and well-nourished.  HENT:  Head: Normocephalic.  Right Ear: External ear normal.  Left Ear: External ear normal.  Nose: Nose normal.  Mouth/Throat: Oropharynx is clear and moist.  Eyes: Conjunctivae and EOM are normal.  Neck: Normal range of motion. Neck supple. No thyromegaly present.  Cardiovascular: Normal rate, regular rhythm and normal heart sounds.   Pulmonary/Chest: Effort normal and breath sounds normal.  Abdominal: Soft. Bowel sounds are normal.  Neurological: He is alert and oriented to person, place, and time.  Psychiatric: His behavior is normal. Thought content normal. His mood appears anxious.      Assessment & Plan:     1. Insomnia, persistent Feels well controlled with use of Ambien each night. Without it he can't initial sleep very well and doesn't sleep. No hangover or other side effects with use of Ambien. Will refill and follow up in 3 months. - zolpidem (AMBIEN) 10 MG tablet; Take 1 tablet (10 mg total) by mouth at bedtime.  Dispense: 90 tablet; Refill: 0  2. Panic disorder without agoraphobia Rare occurrences of panic. Still has some anxiety issues. Has to take Diazepam 10 mg when he goes out to eat away from home with friends. Without it his anxiety/panic spikes and causes him difficulty with breathing and chest discomfort. Denies palpitations or symptoms similar to his past WPW that required ablation surgery 07-30-14. Continue present dosage and recheck in 3 months.

## 2015-05-19 ENCOUNTER — Ambulatory Visit (INDEPENDENT_AMBULATORY_CARE_PROVIDER_SITE_OTHER): Payer: 59 | Admitting: Family Medicine

## 2015-05-19 ENCOUNTER — Encounter: Payer: Self-pay | Admitting: Family Medicine

## 2015-05-19 VITALS — BP 110/72 | HR 73 | Temp 98.1°F | Resp 14 | Wt 179.8 lb

## 2015-05-19 DIAGNOSIS — R058 Other specified cough: Secondary | ICD-10-CM

## 2015-05-19 DIAGNOSIS — R05 Cough: Secondary | ICD-10-CM

## 2015-05-19 MED ORDER — MONTELUKAST SODIUM 10 MG PO TABS: 10.0000 mg | ORAL_TABLET | Freq: Every day | ORAL | Status: DC

## 2015-05-19 MED ORDER — BENZONATATE 200 MG PO CAPS: 200.0000 mg | ORAL_CAPSULE | Freq: Three times a day (TID) | ORAL | Status: DC | PRN

## 2015-05-19 NOTE — Progress Notes (Signed)
Patient ID: Bobby [name], [name]   DOB: 02/07/1986, 30 y.o.   MRN: [id number]   Patient: Bobby [name]    DOB: 08/26/1985   30 y.o.   MRN: [id number] Visit Date: 05/19/2015  Today's Provider: Dortha Kernennis Chrismon, PA   Chief Complaint  Patient presents with  . Cough   Subjective:    Cough This is a new problem. The current episode started 1 to 4 weeks ago. The problem has been unchanged. The problem occurs every few minutes (worse with laughing). The cough is productive of sputum. Pertinent negatives include no chills, ear pain, hemoptysis, nasal congestion, postnasal drip, shortness of breath or wheezing. Associated symptoms comments: Congestion . He has tried OTC cough suppressant for the symptoms. The treatment provided no relief.    Patient Active Problem List   Diagnosis Date Noted  . WPW (Wolff-Parkinson-White syndrome) 09/09/2014  . Blood in feces 09/02/2014  . Acid indigestion 09/02/2014  . ED (erectile dysfunction) of non-organic origin 09/02/2014  . Cannot sleep 09/02/2014  . Episodic paroxysmal anxiety disorder 09/02/2014  . Fast heart beat 09/02/2014  . Clicking jaw syndrome 09/02/2014  . Bilateral tinnitus 09/02/2014  . Arthralgia of shoulder 09/02/2014  . Ventricular pre-excitation with arrhythmia 09/02/2014  . History of hay fever 08/25/2014  . ADD (attention deficit disorder) 08/25/2014  . Anxiety, generalized 08/25/2014  . Insomnia, persistent 08/25/2014  . Panic disorder without agoraphobia 08/25/2014  . Anxiety 12/26/2013  . Paroxysmal supraventricular tachycardia (HCC) 10/29/2013  . Decreased libido 09/17/2009   Past Surgical History  Procedure Laterality Date  . Tonsillectomy  1994  . Cardiac catheterization    . Tonsillectomy     Family History  Problem Relation Age of Onset  . Breast cancer Mother   . Ovarian cancer Mother   . Alcohol abuse Father   . Drug abuse Father   . Heart attack Maternal Grandfather   . Heart attack Paternal Grandfather     . Depression Brother    Previous Medications   DIAZEPAM (VALIUM) 10 MG TABLET    Take 1 tablet (10 mg total) by mouth daily as needed for anxiety.   ZOLPIDEM (AMBIEN) 10 MG TABLET    Take 1 tablet (10 mg total) by mouth at bedtime.   Allergies  Allergen Reactions  . Brompheniramine Maleate Palpitations    Patient states cardiologist cleared him to use decongestants.  Leilani Merl. Decongestant [Oxymetazoline] Palpitations    Patient states cardiologist cleared him to use decongestants.    Review of Systems  Constitutional: Negative.  Negative for chills.  HENT: Positive for congestion. Negative for ear pain and postnasal drip.   Eyes: Negative.   Respiratory: Positive for cough. Negative for hemoptysis, shortness of breath and wheezing.   Cardiovascular: Negative.   Gastrointestinal: Negative.   Endocrine: Negative.   Genitourinary: Negative.   Musculoskeletal: Negative.   Skin: Negative.   Allergic/Immunologic: Negative.   Neurological: Negative.   Hematological: Negative.   Psychiatric/Behavioral: Negative.     Social History  Substance Use Topics  . Smoking status: Never Smoker   . Smokeless tobacco: Never Used  . Alcohol Use: No     Comment: social   Objective:   BP 110/72 mmHg  Pulse 73  Temp(Src) 98.1 F (36.7 C) (Oral)  Resp 14  Wt 179 lb 12.8 oz (81.557 kg)  Physical Exam  Constitutional: He is oriented to person, place, and time. He appears well-developed and well-nourished. No distress.  HENT:  Head: Normocephalic and atraumatic.  Right  Ear: Hearing and external ear normal.  Left Ear: Hearing and external ear normal.  Nose: Nose normal.  Mouth/Throat: Oropharynx is clear and moist.  Eyes: Conjunctivae and lids are normal. Right eye exhibits no discharge. Left eye exhibits no discharge. No scleral icterus.  Neck: Neck supple.  Pulmonary/Chest: Effort normal. No respiratory distress.  Slightly coarse breath sounds without rales or rhonchi.  Abdominal: Soft.  Bowel sounds are normal.  Musculoskeletal: Normal range of motion.  Neurological: He is alert and oriented to person, place, and time.  Skin: Skin is intact. No lesion and no rash noted.  Psychiatric: His speech is normal and behavior is normal. Thought content normal. His mood appears anxious.      Assessment & Plan:     1. Allergic cough Ticklish hacking cough over the past month. No fever, nasal congestion, sore throat or ear ache. No help from Dayquil or Delsym. Increase fluid intake and given Singulair with Tessalon for allergic cough. Should use Zantac or Prilosec for GERD, also. Recheck prn. - benzonatate (TESSALON) 200 MG capsule; Take 1 capsule (200 mg total) by mouth 3 (three) times daily as needed for cough.  Dispense: 30 capsule; Refill: 0 - montelukast (SINGULAIR) 10 MG tablet; Take 1 tablet (10 mg total) by mouth at bedtime.  Dispense: 30 tablet; Refill: 3

## 2015-05-19 NOTE — Patient Instructions (Signed)

## 2015-06-29 ENCOUNTER — Ambulatory Visit (INDEPENDENT_AMBULATORY_CARE_PROVIDER_SITE_OTHER): Payer: 59 | Admitting: Family Medicine

## 2015-06-29 ENCOUNTER — Encounter: Payer: Self-pay | Admitting: Family Medicine

## 2015-06-29 VITALS — BP 102/84 | HR 84 | Temp 98.5°F | Resp 16 | Wt 178.6 lb

## 2015-06-29 DIAGNOSIS — F410 Panic disorder [episodic paroxysmal anxiety] without agoraphobia: Secondary | ICD-10-CM

## 2015-06-29 DIAGNOSIS — G4700 Insomnia, unspecified: Secondary | ICD-10-CM

## 2015-06-29 MED ORDER — ZOLPIDEM TARTRATE 10 MG PO TABS
10.0000 mg | ORAL_TABLET | Freq: Every day | ORAL | Status: DC
Start: 2015-06-29 — End: 2015-12-02

## 2015-06-29 MED ORDER — DIAZEPAM 10 MG PO TABS: 10.0000 mg | ORAL_TABLET | Freq: Every day | ORAL | Status: DC | PRN

## 2015-06-29 NOTE — Progress Notes (Signed)
Patient ID: Bobby [name], male   DOB: 04-07-1985, 30 y.o.   MRN: [id number]   Patient: Bobby [name] Male    DOB: [birth year]   30 y.o.   MRN: [id number] Visit Date: 06/29/2015  Today's Provider: Dortha Kern, PA   Chief Complaint  Patient presents with  . Anxiety  . Insomnia  . Follow-up   Subjective:    Anxiety Presents for follow-up visit. The problem has been waxing and waning. Symptoms include depressed mood, insomnia, nervous/anxious behavior and panic. Primary symptoms comment: worse when going out to eat with friends or social activities . The severity of symptoms is moderate. The symptoms are aggravated by social activities. Hours of sleep per night: with the use of Ambien. The quality of sleep is good.   Compliance with prior treatments has been good.  Insomnia Primary symptoms: sleep disturbance.  The current episode started more than one month. The problem occurs nightly. The problem has been gradually improving since onset. How many beverages per day that contain caffeine: 0 - 1.  Types of beverages you drink: soda. Typical bedtime:  10-11 P.M..     Patient Active Problem List   Diagnosis Date Noted  . WPW (Wolff-Parkinson-White syndrome) 09/09/2014  . Blood in feces 09/02/2014  . Acid indigestion 09/02/2014  . ED (erectile dysfunction) of non-organic origin 09/02/2014  . Cannot sleep 09/02/2014  . Episodic paroxysmal anxiety disorder 09/02/2014  . Fast heart beat 09/02/2014  . Clicking jaw syndrome 09/02/2014  . Bilateral tinnitus 09/02/2014  . Arthralgia of shoulder 09/02/2014  . Ventricular pre-excitation with arrhythmia 09/02/2014  . History of hay fever 08/25/2014  . ADD (attention deficit disorder) 08/25/2014  . Anxiety, generalized 08/25/2014  . Insomnia, persistent 08/25/2014  . Panic disorder without agoraphobia 08/25/2014  . Anxiety 12/26/2013  . Paroxysmal supraventricular tachycardia (HCC) 10/29/2013  . Decreased libido 09/17/2009   Past Surgical  History  Procedure Laterality Date  . Tonsillectomy  1994  . Cardiac catheterization    . Tonsillectomy     Family History  Problem Relation Age of Onset  . Breast cancer Mother   . Ovarian cancer Mother   . Alcohol abuse Father   . Drug abuse Father   . Heart attack Maternal Grandfather   . Heart attack Paternal Grandfather   . Depression Brother    Previous Medications   DIAZEPAM (VALIUM) 10 MG TABLET    Take 1 tablet (10 mg total) by mouth daily as needed for anxiety.   ZOLPIDEM (AMBIEN) 10 MG TABLET    Take 1 tablet (10 mg total) by mouth at bedtime.   Allergies  Allergen Reactions  . Brompheniramine Maleate Palpitations    Patient states cardiologist cleared him to use decongestants.  Leilani Merl [Oxymetazoline] Palpitations    Patient states cardiologist cleared him to use decongestants.    Review of Systems  Constitutional: Negative.   HENT: Negative.   Eyes: Negative.   Respiratory: Negative.   Cardiovascular: Negative.   Gastrointestinal: Negative.   Endocrine: Negative.   Genitourinary: Negative.   Musculoskeletal: Negative.   Skin: Negative.   Allergic/Immunologic: Negative.   Neurological: Negative.   Hematological: Negative.   Psychiatric/Behavioral: Positive for sleep disturbance. The patient is nervous/anxious and has insomnia.     Social History  Substance Use Topics  . Smoking status: Never Smoker   . Smokeless tobacco: Never Used  . Alcohol Use: No     Comment: social   Objective:   BP 102/84 mmHg  Pulse 84  Temp(Src) 98.5 F (36.9 C) (Oral)  Resp 16  Wt 178 lb 9.6 oz (81.012 kg) Body mass index is 25.63 kg/(m^2). Wt Readings from Last 3 Encounters:  06/29/15 178 lb 9.6 oz (81.012 kg)  05/19/15 179 lb 12.8 oz (81.557 kg)  03/30/15 171 lb (77.565 kg)   Physical Exam  Constitutional: He is oriented to person, place, and time. He appears well-developed and well-nourished. No distress.  HENT:  Head: Normocephalic and atraumatic.    Right Ear: Hearing and external ear normal.  Left Ear: Hearing and external ear normal.  Nose: Nose normal.  Mouth/Throat: Oropharynx is clear and moist.  Eyes: Conjunctivae and lids are normal. Right eye exhibits no discharge. Left eye exhibits no discharge. No scleral icterus.  Neck: Neck supple.  Cardiovascular: Normal rate, regular rhythm and normal heart sounds.   Pulmonary/Chest: Effort normal and breath sounds normal. No respiratory distress.  Abdominal: Soft. Bowel sounds are normal.  Musculoskeletal: Normal range of motion.  Neurological: He is alert and oriented to person, place, and time.  Skin: Skin is intact. No lesion and no rash noted.  Psychiatric: His speech is normal and behavior is normal. Thought content normal. His mood appears anxious.  Difficulty initiating sleep without using Ambien.       Assessment & Plan:     1. Insomnia, persistent Tolerating Ambien without daytime drowsiness. Without use of Ambien, he can't initiate sleep. Will refill prescription and recheck in 4 months.  - zolpidem (AMBIEN) 10 MG tablet; Take 1 tablet (10 mg total) by mouth at bedtime.  Dispense: 90 tablet; Refill: 0  2. Panic disorder without agoraphobia Intermittent use of Diazepam for anxiety and panic attacks continues to control symptoms well and allow him to complete tasks on the job. Without use of Diazepam, he will get acute attacks and causes some chest discomfort. Will refill prescription and recheck in 4 months. - diazepam (VALIUM) 10 MG tablet; Take 1 tablet (10 mg total) by mouth daily as needed for anxiety.  Dispense: 30 tablet; Refill: 3

## 2015-07-13 ENCOUNTER — Encounter: Payer: Self-pay | Admitting: Family Medicine

## 2015-07-13 ENCOUNTER — Ambulatory Visit (INDEPENDENT_AMBULATORY_CARE_PROVIDER_SITE_OTHER): Payer: 59 | Admitting: Family Medicine

## 2015-07-13 VITALS — BP 118/82 | HR 80 | Temp 97.8°F | Resp 16 | Wt 182.6 lb

## 2015-07-13 DIAGNOSIS — R600 Localized edema: Secondary | ICD-10-CM | POA: Diagnosis not present

## 2015-07-13 NOTE — Progress Notes (Signed)
Patient ID: Bobby LivelyJohn A [name], male   DOB: 04/16/1985, 30 y.o.   MRN: [id number]   Patient: Bobby [name] Male    DOB: 10/05/1985   30 y.o.   MRN: [id number] Visit Date: 07/13/2015  Today's Provider: Dortha Kernennis Chrismon, PA   Chief Complaint  Patient presents with  . Facial Swelling   Subjective:    HPI  Facial Swelling:  Patient reports he had FUT hair transplant surgery done on Thursday. On Saturday patient noticed facial swelling that worse around right eye. Patient states he contacted surgeon that done the hair transplant and he said facial swelling is rare but can occur after procedure. Patient is requesting a second opinion.   Patient Active Problem List   Diagnosis Date Noted  . WPW (Wolff-Parkinson-White syndrome) 09/09/2014  . Blood in feces 09/02/2014  . Acid indigestion 09/02/2014  . ED (erectile dysfunction) of non-organic origin 09/02/2014  . Cannot sleep 09/02/2014  . Episodic paroxysmal anxiety disorder 09/02/2014  . Fast heart beat 09/02/2014  . Clicking jaw syndrome 09/02/2014  . Bilateral tinnitus 09/02/2014  . Arthralgia of shoulder 09/02/2014  . Ventricular pre-excitation with arrhythmia 09/02/2014  . History of hay fever 08/25/2014  . ADD (attention deficit disorder) 08/25/2014  . Anxiety, generalized 08/25/2014  . Insomnia, persistent 08/25/2014  . Panic disorder without agoraphobia 08/25/2014  . Anxiety 12/26/2013  . Paroxysmal supraventricular tachycardia (HCC) 10/29/2013  . Decreased libido 09/17/2009   Past Surgical History  Procedure Laterality Date  . Tonsillectomy  1994  . Cardiac catheterization    . Tonsillectomy     Family History  Problem Relation Age of Onset  . Breast cancer Mother   . Ovarian cancer Mother   . Alcohol abuse Father   . Drug abuse Father   . Heart attack Maternal Grandfather   . Heart attack Paternal Grandfather   . Depression Brother    Previous Medications   DIAZEPAM (VALIUM) 10 MG TABLET    Take 1 tablet (10 mg total) by  mouth daily as needed for anxiety.   ZOLPIDEM (AMBIEN) 10 MG TABLET    Take 1 tablet (10 mg total) by mouth at bedtime.   Allergies  Allergen Reactions  . Brompheniramine Maleate Palpitations    Patient states cardiologist cleared him to use decongestants.  Leilani Merl. Decongestant [Oxymetazoline] Palpitations    Patient states cardiologist cleared him to use decongestants.    Review of Systems  Constitutional: Negative.   HENT: Negative.   Eyes: Negative.   Respiratory: Negative.   Cardiovascular: Negative.   Gastrointestinal: Negative.   Endocrine: Negative.   Genitourinary: Negative.   Musculoskeletal: Negative.   Skin: Negative.   Neurological:       Facial swelling   Hematological: Negative.   Psychiatric/Behavioral: Negative.     Social History  Substance Use Topics  . Smoking status: Never Smoker   . Smokeless tobacco: Never Used  . Alcohol Use: No     Comment: social   Objective:   BP 118/82 mmHg  Pulse 80  Temp(Src) 97.8 F (36.6 C) (Oral)  Resp 16  Wt 182 lb 9.6 oz (82.827 kg) Body mass index is 26.2 kg/(m^2).  Wt Readings from Last 3 Encounters:  07/13/15 182 lb 9.6 oz (82.827 kg)  06/29/15 178 lb 9.6 oz (81.012 kg)  05/19/15 179 lb 12.8 oz (81.557 kg)    Physical Exam  Constitutional: He is oriented to person, place, and time. He appears well-developed and well-nourished. No distress.  HENT:  Head: Normocephalic and  atraumatic.  Right Ear: Hearing normal.  Left Ear: Hearing normal.  Nose: Nose normal.  Eyes: Conjunctivae and lids are normal. Right eye exhibits no discharge. Left eye exhibits no discharge. No scleral icterus.  Cardiovascular: Normal rate.   Pulmonary/Chest: Effort normal. No respiratory distress.  Musculoskeletal: Normal range of motion.  Neurological: He is alert and oriented to person, place, and time.  Skin: Skin is intact. No lesion and no rash noted.  Healing large incision across the occiput from ear to ear. Multiple pinpoint scabs  from hair transplants. Swelling around eyes with slight ecchymotic discoloration secondary to surgical procedure.  Psychiatric: He has a normal mood and affect. His speech is normal and behavior is normal. Thought content normal.      Assessment & Plan:     1. Facial edema Onset following hair transplant surgery on 07-09-15. Puffy around eyes and upper cheeks. No sign of infection at surgical site across top of head and occiput. Advised this is a normal post operative reaction to this procedure. Should resolve in 3-5 days. Follow up with surgeon as needed.

## 2015-10-16 ENCOUNTER — Encounter: Payer: Self-pay | Admitting: Family Medicine

## 2015-10-16 ENCOUNTER — Ambulatory Visit (INDEPENDENT_AMBULATORY_CARE_PROVIDER_SITE_OTHER): Payer: 59 | Admitting: Family Medicine

## 2015-10-16 VITALS — BP 110/80 | HR 88 | Temp 97.0°F | Resp 16 | Ht 71.0 in | Wt 168.0 lb

## 2015-10-16 DIAGNOSIS — F410 Panic disorder [episodic paroxysmal anxiety] without agoraphobia: Secondary | ICD-10-CM | POA: Diagnosis not present

## 2015-10-16 DIAGNOSIS — R1013 Epigastric pain: Secondary | ICD-10-CM

## 2015-10-16 NOTE — Progress Notes (Signed)
Patient: Bobby [name] Male    DOB: 07/28/1985   30 y.o.   MRN: [id number] Visit Date: 10/16/2015  Today's Provider: Dortha Kernennis Chrismon, PA   Chief Complaint  Patient presents with  . Respiratory Distress    after eating   Subjective:    HPI Difficulty breathing: Patient reports symptoms are present after eating 95% of the time. Patient denies chest pain, arm pain or jaw pain. Patient reports that he has been taking Valium once a day before lunch. Patient reports that taking Valium has helped  prevent symptoms. Patient reports that he does have panic attacks when he thinks about shortness of breath. Patient reports heart burn. Patient was seen by Dr. Juliann Paresallwood today cardio and was prescribed omeprazole.  Family History  Problem Relation Age of Onset  . Breast cancer Mother   . Ovarian cancer Mother   . Alcohol abuse Father   . Drug abuse Father   . Heart attack Maternal Grandfather   . Heart attack Paternal Grandfather   . Depression Brother    Past Medical History:  Diagnosis Date  . ADHD (attention deficit hyperactivity disorder)   . Anxiety   . SVT (supraventricular tachycardia) (HCC)    resolved since ablation  . WPW (Wolff-Parkinson-White syndrome)    resolved since ablation   Past Surgical History:  Procedure Laterality Date  . CARDIAC CATHETERIZATION    . TONSILLECTOMY  1994  . TONSILLECTOMY     Allergies  Allergen Reactions  . Brompheniramine Maleate Palpitations    Patient states cardiologist cleared him to use decongestants.  Leilani Merl. Decongestant [Oxymetazoline] Palpitations    Patient states cardiologist cleared him to use decongestants.   Current Meds  Medication Sig  . diazepam (VALIUM) 10 MG tablet Take 1 tablet (10 mg total) by mouth daily as needed for anxiety.  Marland Kitchen. omeprazole (PRILOSEC) 20 MG capsule Take 1 capsule by mouth daily.  Marland Kitchen. zolpidem (AMBIEN) 10 MG tablet Take 1 tablet (10 mg total) by mouth at bedtime.    Review of Systems  Respiratory:  Positive for shortness of breath.   Psychiatric/Behavioral: The patient is nervous/anxious.     Social History  Substance Use Topics  . Smoking status: Never Smoker  . Smokeless tobacco: Never Used  . Alcohol use No     Comment: social   Objective:   BP 110/80 (BP Location: Right Arm, Patient Position: Sitting, Cuff Size: Normal)   Pulse 88   Temp 97 F (36.1 C) (Oral)   Resp 16   Ht 5\' 11"  (1.803 m)   Wt 168 lb (76.2 kg)   BMI 23.43 kg/m   Physical Exam  Constitutional: He is oriented to person, place, and time. He appears well-developed and well-nourished. No distress.  HENT:  Head: Normocephalic and atraumatic.  Right Ear: Hearing normal.  Left Ear: Hearing normal.  Nose: Nose normal.  Eyes: Conjunctivae and lids are normal. Right eye exhibits no discharge. Left eye exhibits no discharge. No scleral icterus.  Neck: Neck supple.  Cardiovascular: Normal rate and regular rhythm.   Pulmonary/Chest: Effort normal and breath sounds normal. No respiratory distress.  Abdominal: Soft. Bowel sounds are normal.  Musculoskeletal: Normal range of motion.  Neurological: He is alert and oriented to person, place, and time.  Skin: Skin is intact. No lesion and no rash noted.  Psychiatric: His speech is normal and behavior is normal. His mood appears anxious. Thought content is paranoid.      Assessment & Plan:  1. Dyspepsia Recent flare of heartburn. Cardiology evaluation by Dr. Juliann Pares was negative for acute heart disease. Encouraged to use reflux precautions and proceed with use of Omeprazole. Recheck progress in 2-3 months as needed.  2. Panic disorder without agoraphobia Intermittent recurrences with good control using Valium once a day prn. Recommend regular exercise program. Declines counseling or psychiatric evaluation at the present. Recommend recheck in 3 months or sooner if needed.       Dortha Kern, PA  [facility]

## 2015-10-16 NOTE — Patient Instructions (Signed)
Indigestion °Indigestion is a feeling of pain, discomfort, burning, or fullness in the upper part of your abdomen. It can come and go. It may occur frequently or rarely. Indigestion tends to occur while you are eating or right after you have finished eating. It may be worse at night and while bending over or lying down. °HOME CARE INSTRUCTIONS °Take these actions to decrease your pain or discomfort and to help avoid complications. °Diet °· Follow a diet as recommended by your health care provider. This may involve avoiding foods and drinks such as: °¨ Coffee and tea (with or without caffeine). °¨ Drinks that contain alcohol. °¨ Energy drinks and sports drinks. °¨ Carbonated drinks or sodas. °¨ Chocolate and cocoa. °¨ Peppermint and mint flavorings. °¨ Garlic and onions. °¨ Horseradish. °¨ Spicy and acidic foods, including peppers, chili powder, curry powder, vinegar, hot sauces, and barbecue sauce. °¨ Citrus fruit juices and citrus fruits, such as oranges, lemons, and limes. °¨ Tomato-based foods, such as red sauce, chili, salsa, and pizza with red sauce. °¨ Fried and fatty foods, such as donuts, french fries, potato chips, and high-fat dressings. °¨ High-fat meats, such as hot dogs and fatty cuts of red and white meats, such as rib eye steak, sausage, ham, and bacon. °¨ High-fat dairy items, such as whole milk, butter, and cream cheese. °· Eat small, frequent meals instead of large meals. °· Avoid drinking large amounts of liquid with your meals. °· Avoid eating meals during the 2-3 hours before bedtime. °· Avoid lying down right after you eat. °· Do not exercise right after you eat. °General Instructions °· Pay attention to any changes in your symptoms. °· Take over-the-counter and prescription medicines only as told by your health care provider. Do not take aspirin, ibuprofen, or other NSAIDs unless your health care provider told you to do so. °· Do not use any tobacco products, including cigarettes, chewing  tobacco, and e-cigarettes. If you need help quitting, ask your health care provider. °· Wear loose-fitting clothing. Do not wear anything tight around your waist that causes pressure on your abdomen. °· Raise (elevate) the head of your bed about 6 inches (15 cm). °· Try to reduce your stress, such as with yoga or meditation. If you need help reducing stress, ask your health care provider. °· If you are overweight, reduce your weight to an amount that is healthy for you. Ask your health care provider for guidance about a safe weight loss goal. °· Keep all follow-up visits as told by your health care provider. This is important. °SEEK MEDICAL CARE IF: °· You have new symptoms. °· You have unexplained weight loss. °· You have difficulty swallowing, or it hurts to swallow. °· Your symptoms do not improve with treatment. °· Your symptoms last for more than two days. °· You have a fever. °· You vomit. °SEEK IMMEDIATE MEDICAL CARE IF: °· You have pain in your arms, neck, jaw, teeth, or back. °· You feel sweaty, dizzy, or light-headed. °· You faint. °· You have chest pain or shortness of breath. °· You cannot stop vomiting, or you vomit blood. °· Your stool is bloody or black. °· You have severe pain in your abdomen. °  °This information is not intended to replace advice given to you by your health care provider. Make sure you discuss any questions you have with your health care provider. °  °Document Released: 03/31/2004 Document Revised: 11/12/2014 Document Reviewed: 06/18/2014 °Elsevier Interactive Patient Education ©2016 [employer] ° °

## 2015-11-27 ENCOUNTER — Other Ambulatory Visit: Payer: Self-pay | Admitting: Family Medicine

## 2015-11-27 DIAGNOSIS — G4700 Insomnia, unspecified: Secondary | ICD-10-CM

## 2015-12-02 ENCOUNTER — Other Ambulatory Visit: Payer: Self-pay | Admitting: Family Medicine

## 2015-12-02 DIAGNOSIS — G4700 Insomnia, unspecified: Secondary | ICD-10-CM

## 2015-12-02 NOTE — Telephone Encounter (Signed)
Pt needs refill on his generic ambien.  [facility] [address]  [place]

## 2015-12-03 ENCOUNTER — Encounter: Payer: Self-pay | Admitting: Emergency Medicine

## 2015-12-03 ENCOUNTER — Emergency Department: Payer: 59

## 2015-12-03 ENCOUNTER — Emergency Department
Admission: EM | Admit: 2015-12-03 | Discharge: 2015-12-03 | Disposition: A | Discharge status: Home / Self Care | Payer: 59 | Attending: Emergency Medicine | Admitting: Emergency Medicine

## 2015-12-03 DIAGNOSIS — F419 Anxiety disorder, unspecified: Secondary | ICD-10-CM

## 2015-12-03 DIAGNOSIS — F32A Depression, unspecified: Secondary | ICD-10-CM

## 2015-12-03 DIAGNOSIS — R519 Headache, unspecified: Secondary | ICD-10-CM

## 2015-12-03 DIAGNOSIS — Z79899 Other long term (current) drug therapy: Secondary | ICD-10-CM | POA: Insufficient documentation

## 2015-12-03 DIAGNOSIS — F909 Attention-deficit hyperactivity disorder, unspecified type: Secondary | ICD-10-CM | POA: Insufficient documentation

## 2015-12-03 DIAGNOSIS — F322 Major depressive disorder, single episode, severe without psychotic features: Secondary | ICD-10-CM | POA: Diagnosis not present

## 2015-12-03 DIAGNOSIS — R51 Headache: Secondary | ICD-10-CM | POA: Diagnosis not present

## 2015-12-03 DIAGNOSIS — F329 Major depressive disorder, single episode, unspecified: Secondary | ICD-10-CM

## 2015-12-03 DIAGNOSIS — R4182 Altered mental status, unspecified: Secondary | ICD-10-CM | POA: Diagnosis present

## 2015-12-03 LAB — SALICYLATE LEVEL

## 2015-12-03 LAB — URINALYSIS COMPLETE WITH MICROSCOPIC (ARMC ONLY)
BACTERIA UA: NONE SEEN
Bilirubin Urine: NEGATIVE
Glucose, UA: NEGATIVE mg/dL
Hgb urine dipstick: NEGATIVE
LEUKOCYTES UA: NEGATIVE
Nitrite: NEGATIVE
PH: 7 (ref 5.0–8.0)
PROTEIN: NEGATIVE mg/dL
RBC / HPF: NONE SEEN RBC/hpf (ref 0–5)
Specific Gravity, Urine: 1.008 (ref 1.005–1.030)
WBC, UA: NONE SEEN WBC/hpf (ref 0–5)

## 2015-12-03 LAB — CBC WITH DIFFERENTIAL/PLATELET
BASOS ABS: 0 10*3/uL (ref 0–0.1)
BASOS PCT: 0 %
Eosinophils Absolute: 0 10*3/uL (ref 0–0.7)
Eosinophils Relative: 0 %
HEMATOCRIT: 45.6 % (ref 40.0–52.0)
Hemoglobin: 16.1 g/dL (ref 13.0–18.0)
LYMPHS PCT: 8 %
Lymphs Abs: 0.7 10*3/uL — ABNORMAL LOW (ref 1.0–3.6)
MCH: 31.6 pg (ref 26.0–34.0)
MCHC: 35.3 g/dL (ref 32.0–36.0)
MCV: 89.6 fL (ref 80.0–100.0)
Monocytes Absolute: 0.6 10*3/uL (ref 0.2–1.0)
Monocytes Relative: 7 %
NEUTROS ABS: 6.8 10*3/uL — AB (ref 1.4–6.5)
NEUTROS PCT: 85 %
Platelets: 217 10*3/uL (ref 150–440)
RBC: 5.09 MIL/uL (ref 4.40–5.90)
RDW: 13.2 % (ref 11.5–14.5)
WBC: 8.1 10*3/uL (ref 3.8–10.6)

## 2015-12-03 LAB — COMPREHENSIVE METABOLIC PANEL
ALBUMIN: 4.6 g/dL (ref 3.5–5.0)
ALT: 28 U/L (ref 17–63)
AST: 23 U/L (ref 15–41)
Alkaline Phosphatase: 67 U/L (ref 38–126)
Anion gap: 8 (ref 5–15)
BILIRUBIN TOTAL: 1.9 mg/dL — AB (ref 0.3–1.2)
BUN: 8 mg/dL (ref 6–20)
CHLORIDE: 104 mmol/L (ref 101–111)
CO2: 27 mmol/L (ref 22–32)
CREATININE: 0.95 mg/dL (ref 0.61–1.24)
Calcium: 9.4 mg/dL (ref 8.9–10.3)
GFR calc Af Amer: >60 mL/min (ref >60)
GLUCOSE: 115 mg/dL — AB (ref 65–99)
POTASSIUM: 3.5 mmol/L (ref 3.5–5.1)
Sodium: 139 mmol/L (ref 135–145)
Total Protein: 7.4 g/dL (ref 6.5–8.1)

## 2015-12-03 LAB — URINE DRUG SCREEN, QUALITATIVE (ARMC ONLY)
AMPHETAMINES, UR SCREEN: NOT DETECTED
BARBITURATES, UR SCREEN: NOT DETECTED
BENZODIAZEPINE, UR SCRN: POSITIVE — AB
Cannabinoid 50 Ng, Ur ~~LOC~~: NOT DETECTED
Cocaine Metabolite,Ur ~~LOC~~: NOT DETECTED
MDMA (Ecstasy)Ur Screen: NOT DETECTED
METHADONE SCREEN, URINE: NOT DETECTED
Opiate, Ur Screen: NOT DETECTED
Phencyclidine (PCP) Ur S: NOT DETECTED
TRICYCLIC, UR SCREEN: NOT DETECTED

## 2015-12-03 LAB — ACETAMINOPHEN LEVEL

## 2015-12-03 LAB — ETHANOL

## 2015-12-03 LAB — TROPONIN I

## 2015-12-03 MED ORDER — LORAZEPAM 2 MG/ML IJ SOLN
1.0000 mg | Freq: Once | INTRAMUSCULAR | Status: AC
  Administered 2015-12-03: 1 mg via INTRAVENOUS
  Filled 2015-12-03: qty 1

## 2015-12-03 MED ORDER — KETOROLAC TROMETHAMINE 30 MG/ML IJ SOLN
30.0000 mg | Freq: Once | INTRAMUSCULAR | Status: AC
  Administered 2015-12-03: 30 mg via INTRAVENOUS
  Filled 2015-12-03: qty 1

## 2015-12-03 MED ORDER — SODIUM CHLORIDE 0.9 % IV SOLN
1000.0000 mL | Freq: Once | INTRAVENOUS | Status: AC
  Administered 2015-12-03: 1000 mL via INTRAVENOUS

## 2015-12-03 MED ORDER — ZOLPIDEM TARTRATE 10 MG PO TABS: 10.0000 mg | ORAL_TABLET | Freq: Every day | ORAL | 0 refills | Status: DC

## 2015-12-03 NOTE — ED Provider Notes (Addendum)
[facility] Emergency Department Provider Note        Time seen: ----------------------------------------- 4:59 PM on 12/03/2015 -----------------------------------------    I have reviewed the triage vital signs and the nursing notes.   HISTORY  Chief Complaint Headache    HPI Bobby [name] is a 30 y.o. male who presents to the ERafter coming from home with complaints of a headache. The farm Department was first on the scene, found the patient lying on the floor, unresponsive but crying. Part of heart place the patient in c-collar and backboard the patient. Patient was brought in verbal but refusing to move for EMS, took Valium at work and a friend drove him home. Patient does have history of anxiety. It is unclear if he has fallen or not. Patient denies recent illness. Currently he is complaining of headache   Past Medical History:  Diagnosis Date  . ADHD (attention deficit hyperactivity disorder)   . Anxiety   . SVT (supraventricular tachycardia) (HCC)    resolved since ablation  . WPW (Wolff-Parkinson-White syndrome)    resolved since ablation    Patient Active Problem List   Diagnosis Date Noted  . WPW (Wolff-Parkinson-White syndrome) 09/09/2014  . Blood in feces 09/02/2014  . Acid indigestion 09/02/2014  . ED (erectile dysfunction) of non-organic origin 09/02/2014  . Cannot sleep 09/02/2014  . Episodic paroxysmal anxiety disorder 09/02/2014  . Fast heart beat 09/02/2014  . Clicking jaw syndrome 09/02/2014  . Bilateral tinnitus 09/02/2014  . Arthralgia of shoulder 09/02/2014  . Ventricular pre-excitation with arrhythmia 09/02/2014  . History of hay fever 08/25/2014  . ADD (attention deficit disorder) 08/25/2014  . Anxiety, generalized 08/25/2014  . Insomnia, persistent 08/25/2014  . Panic disorder without agoraphobia 08/25/2014  . Anxiety 12/26/2013  . Paroxysmal supraventricular tachycardia (HCC) 10/29/2013  . Decreased libido  09/17/2009    Past Surgical History:  Procedure Laterality Date  . CARDIAC CATHETERIZATION    . TONSILLECTOMY  1994  . TONSILLECTOMY      Allergies Brompheniramine maleate and Decongestant [oxymetazoline]  Social History Social History  Substance Use Topics  . Smoking status: Never Smoker  . Smokeless tobacco: Never Used  . Alcohol use No     Comment: social    Review of Systems Constitutional: Negative for fever. Cardiovascular: Negative for chest pain. Respiratory: Negative for shortness of breath. Gastrointestinal: Negative for abdominal pain, vomiting and diarrhea. Genitourinary: Negative for dysuria. Musculoskeletal: Negative for back pain. Skin: Negative for rash. Neurological: Positive for headache  10-point ROS otherwise negative.  ____________________________________________   PHYSICAL EXAM:  VITAL SIGNS: ED Triage Vitals  Enc Vitals Group     BP      Pulse      Resp      Temp      Temp src      SpO2      Weight      Height      Head Circumference      Peak Flow      Pain Score      Pain Loc      Pain Edu?      Excl. in GC?     Constitutional: Alert, Disoriented, no distress Eyes: Conjunctivae are normal. PERRL. Normal extraocular movements. ENT   Head: Normocephalic and atraumatic.   Nose: No congestion/rhinnorhea.   Mouth/Throat: Mucous membranes are moist.   Neck: No stridor. Cardiovascular: Normal rate, regular rhythm. No murmurs, rubs, or gallops. Respiratory: Normal respiratory effort without tachypnea nor retractions.  Breath sounds are clear and equal bilaterally. No wheezes/rales/rhonchi. Gastrointestinal: Soft and nontender. Normal bowel sounds Musculoskeletal: Nontender with normal range of motion in all extremities. No lower extremity tenderness nor edema. Neurologic:  Normal speech and language. No gross focal neurologic deficits are appreciated. Patient intermittently following commands Skin:  Skin is warm, dry  and intact. No rash noted. Psychiatric: Bizarre mood and affect, crying at times ____________________________________________  EKG: Interpreted by me. Sinus rhythm rate 88 bpm, normal PR interval, normal QRS, normal QT interval. Normal axis.  ____________________________________________  ED COURSE:  Pertinent labs & imaging results that were available during my care of the patient were reviewed by me and considered in my medical decision making (see chart for details). Clinical Course  Patient presents to ER with altered mental status and a possible fall. We will check basic labs and imaging.  Procedures ____________________________________________   LABS (pertinent positives/negatives)  Labs Reviewed  CBC WITH DIFFERENTIAL/PLATELET - Abnormal; Notable for the following:       Result Value   Neutro Abs 6.8 (*)    Lymphs Abs 0.7 (*)    All other components within normal limits  COMPREHENSIVE METABOLIC PANEL - Abnormal; Notable for the following:    Glucose, Bld 115 (*)    Total Bilirubin 1.9 (*)    All other components within normal limits  ACETAMINOPHEN LEVEL - Abnormal; Notable for the following:    Acetaminophen (Tylenol), Serum <10 (*)    All other components within normal limits  TROPONIN I  ETHANOL  SALICYLATE LEVEL  URINALYSIS COMPLETEWITH MICROSCOPIC ([facility] ONLY)  URINE DRUG SCREEN, QUALITATIVE ([facility] ONLY)    RADIOLOGY  CT head, C-spine Are unremarkable ____________________________________________  FINAL ASSESSMENT AND PLAN  Altered mental status, depression, anxiety  Plan: Patient with labs and imaging as dictated above. Patient presents to the ER severely anxious and dealing with recent stress and possibly depression. Patient states his brother's killing himself, his grandmother is dying and his father's imprisonment. He admits to being under severe stress from these issues. He currently is medically stable, will consult psychiatry for further  disposition.   Emily FilbertWilliams, Kennethia Lynes E, MD Patient has been evaluated by Tele-psychiatry, he is not felt to need inpatient hospitalization. He is given follow-up with his primary care doctor and will continue home medications. He is stable for outpatient follow-up.  Note: This dictation was prepared with Dragon dictation. Any transcriptional errors that result from this process are unintentional    Emily FilbertJonathan E Sharlena Kristensen, MD 12/03/15 1850    Emily FilbertJonathan E Hanah Moultry, MD 12/03/15 2139    Emily FilbertJonathan E Issaiah Seabrooks, MD 12/03/15 [phone]

## 2015-12-03 NOTE — ED Notes (Signed)
Patient mother requesting her number to be left. Tina Apple [phone] or [phone].

## 2015-12-03 NOTE — ED Notes (Signed)
Patient transported to CT 

## 2015-12-03 NOTE — ED Notes (Signed)
Urine sample collected before any medications given to patient.

## 2015-12-03 NOTE — ED Notes (Signed)
Resumed care from emma rn.  Pt resting quietly.  Pt calm and cooperative.

## 2015-12-03 NOTE — ED Notes (Signed)
Patient speaking to Telepsych at this time.

## 2015-12-03 NOTE — ED Triage Notes (Signed)
Per [facility], patient comes from home with c/o HA. FD first on scene. Found patient on floor, unresponsive but crying. FD placed patient in C collar and back boarded patient. Corneal reflexes intact, patient verbal. Patient refusing to move for EMS. When placing patient in hallway stretcher patient states, "Fine. I'll move". Patient took valium at work, friend drove him home.

## 2015-12-03 NOTE — BH Assessment (Signed)
Assessment Note  Bobby [name] is an 30 y.o. male. Pt reports he was transported to the hospital after reportedly not feeling well while at work. Pt states "I woke up this morning and I was feeling kind of swimmy headed. 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 Pt reports he took a nap and woke up "and I was soaking wet from sweat". Pt reports this is the first time he has ever felt like this. Pt reports a current prescription of Valium and Ambien prescribed by his primary care physician. Pt states he has been taking his current regimen of medication for 2 years. He also reports she has been diagnosed with anxiety in the past. Pt denied sxs of depression; however, he attributed his "down feelings" to his grandmother's declining health. Pt states his personal stressors have increased within this past week. Pt also reports his anxiety level to have decreased since presenting to the ED. Pt reports having a difficult time getting to sleep but reports this is improved by his Ambien prescription. When asked about possible outpatient treatment to address his anxiety sxs, pt states he is unable to afford psychiatric treatment at this time. Pt reports to have full time employment with [employer] where he has worked the last 8 years. Pt denied past/curent SI/HI/Hallucinations/Delusions. Pt was pleasant and cooperative with TTS staff during assessment. Pt was alert and oriented x4.    Diagnosis: Anxiety Disorder  Past Medical History:  Past Medical History:  Diagnosis Date  . ADHD (attention deficit hyperactivity disorder)   . Anxiety   . SVT (supraventricular tachycardia) (HCC)    resolved since ablation  . WPW (Wolff-Parkinson-White syndrome)    resolved since ablation    Past Surgical History:  Procedure Laterality Date  . CARDIAC CATHETERIZATION    . TONSILLECTOMY  1994  . TONSILLECTOMY      Family History:  Family History  Problem  Relation Age of Onset  . Breast cancer Mother   . Ovarian cancer Mother   . Alcohol abuse Father   . Drug abuse Father   . Heart attack Maternal Grandfather   . Heart attack Paternal Grandfather   . Depression Brother     Social History:  reports that he has never smoked. He has never used smokeless tobacco. He reports that he does not drink alcohol or use drugs.  Additional Social History:  Alcohol / Drug Use Pain Medications: None Reported Prescriptions: Ambien, Valium Over the Counter: None Reported History of alcohol / drug use?: No history of alcohol / drug abuse  CIWA: CIWA-Ar BP: 115/72 Pulse Rate: 97 COWS:    Allergies:  Allergies  Allergen Reactions  . Brompheniramine Maleate Palpitations    Patient states cardiologist cleared him to use decongestants.  Leilani Merl [Oxymetazoline] Palpitations    Patient states cardiologist cleared him to use decongestants.    Home Medications:  (Not in a hospital admission)  OB/GYN Status:  No LMP for male patient.  General Assessment Data Location of Assessment: [facility] ED TTS Assessment: In system Is this a Tele or Face-to-Face Assessment?: Face-to-Face Is this an Initial Assessment or a Re-assessment for this encounter?: Initial Assessment Marital status: Single Maiden name: N/A Is patient pregnant?: No Pregnancy Status: No Living Arrangements: Parent (Mom, stepdad, and sister) Can pt return to current living arrangement?: Yes Admission Status: Voluntary Is patient capable of signing voluntary admission?: Yes Referral Source: Self/Family/Friend Insurance type: UHC  Medical Screening Exam [facility] Walk-in ONLY) Medical Exam completed: Yes  Crisis Care Plan Living Arrangements: Parent (Mom, stepdad, and sister) Legal Guardian: Other: (Self) Name of Psychiatrist: None Name of Therapist: None  Education Status Is patient currently in school?: No Current Grade: N/A Highest grade of school patient has completed:  Associate's Degree Name of school: N/A Contact person: N/A  Risk to self with the past 6 months Suicidal Ideation: No Has patient been a risk to self within the past 6 months prior to admission? : No Suicidal Intent: No Has patient had any suicidal intent within the past 6 months prior to admission? : No Is patient at risk for suicide?: No Suicidal Plan?: No Has patient had any suicidal plan within the past 6 months prior to admission? : No Access to Means: No What has been your use of drugs/alcohol within the last 12 months?: None Reported Previous Attempts/Gestures: No How many times?: 0 Other Self Harm Risks: None Reported Triggers for Past Attempts: None known Intentional Self Injurious Behavior: None Family Suicide History: No Recent stressful life event(s): Financial Problems, Conflict (Comment), Loss (Comment) (Family issues; pt's grandmother health is declining) Persecutory voices/beliefs?: No Depression: Yes Depression Symptoms: Insomnia Substance abuse history and/or treatment for substance abuse?: No Suicide prevention information given to non-admitted patients: Not applicable  Risk to Others within the past 6 months Homicidal Ideation: No Does patient have any lifetime risk of violence toward others beyond the six months prior to admission? : No Thoughts of Harm to Others: No Current Homicidal Intent: No Current Homicidal Plan: No Access to Homicidal Means: No Identified Victim: N/A History of harm to others?: No Assessment of Violence: None Noted Violent Behavior Description: N/A Does patient have access to weapons?: No Criminal Charges Pending?: No Does patient have a court date: No Court Date:  (N/A) Is patient on probation?: No  Psychosis Hallucinations: None noted Delusions: None noted  Mental Status Report Appearance/Hygiene: In scrubs, In hospital gown Eye Contact: Good Motor Activity: Freedom of movement, Unremarkable Speech:  Logical/coherent Level of Consciousness: Alert Mood: Pleasant Affect: Appropriate to circumstance Anxiety Level: Moderate Thought Processes: Coherent, Relevant Judgement: Unimpaired Orientation: Person, Place, Time, Situation, Appropriate for developmental age Obsessive Compulsive Thoughts/Behaviors: None  Cognitive Functioning Concentration: Normal Memory: Recent Intact, Remote Intact IQ: Above Average Insight: Good Impulse Control: Good Appetite: Fair (Pt reports he has not eaten a meal today due to his anxiety) Weight Loss: 0 Weight Gain: 0 Sleep: Decreased Total Hours of Sleep: 7 Vegetative Symptoms: None  ADLScreening [facility] Assessment Services) Patient's cognitive ability adequate to safely complete daily activities?: Yes Patient able to express need for assistance with ADLs?: Yes Independently performs ADLs?: Yes (appropriate for developmental age)  Prior Inpatient Therapy Prior Inpatient Therapy: No Prior Therapy Dates: N/A Prior Therapy Facilty/Provider(s): N/A Reason for Treatment: N/A  Prior Outpatient Therapy Prior Outpatient Therapy: Yes Prior Therapy Dates: Dates [facility] Prior Therapy Facilty/Provider(s): Dr. Lucianne MussLima Reason for Treatment: Depression; Anxiety Does patient have an ACCT team?: No Does patient have Intensive In-House Services?  : No Does patient have [facility] services? : No Does patient have P4CC services?: No  ADL Screening (condition at time of admission) Patient's cognitive ability adequate to safely complete daily activities?: Yes Patient able to express need for assistance with ADLs?: Yes Independently performs ADLs?: Yes (appropriate for developmental age)       Abuse/Neglect Assessment (Assessment to be complete while patient is alone) Physical Abuse: Denies Verbal Abuse: Denies Sexual Abuse: Denies Exploitation of patient/patient's  resources: Denies Self-Neglect: Denies Values / Beliefs Cultural Requests During Hospitalization:  None Spiritual Requests During Hospitalization: None Consults Spiritual Care Consult Needed: No Social Work Consult Needed: No Merchant navy officer (For Healthcare) Does patient have an advance directive?: No    Additional Information 1:1 In Past 12 Months?: No CIRT Risk: No Elopement Risk: No Does patient have medical clearance?: Yes  Child/Adolescent Assessment Running Away Risk: Denies Bed-Wetting: Denies Destruction of Property: Denies Cruelty to Animals: Denies Stealing: Denies Rebellious/Defies Authority: Denies Satanic Involvement: Denies Archivist: Denies Problems at [employer]: Denies Gang Involvement: Denies  Disposition:  Disposition Initial Assessment Completed for this Encounter: Yes Disposition of Patient: Referred to (Outpatient Tx) Patient referred to: Other (Comment) ([facility] Outpatient Tx Provider)  On Site Evaluation by:   Reviewed with Physician:    Wilmon Arms 12/03/2015 9:39 PM

## 2015-12-07 ENCOUNTER — Telehealth: Payer: Self-pay | Admitting: Family Medicine

## 2015-12-07 NOTE — Telephone Encounter (Signed)
Pt was discharged form [facility] on 12/04/2015 due to passing out at home.  I have scheduled a follow up appointment/MW

## 2015-12-08 ENCOUNTER — Encounter: Payer: Self-pay | Admitting: Family Medicine

## 2015-12-08 ENCOUNTER — Ambulatory Visit (INDEPENDENT_AMBULATORY_CARE_PROVIDER_SITE_OTHER): Payer: 59 | Admitting: Family Medicine

## 2015-12-08 VITALS — BP 108/82 | HR 68 | Temp 97.8°F | Resp 14 | Wt 164.6 lb

## 2015-12-08 DIAGNOSIS — F410 Panic disorder [episodic paroxysmal anxiety] without agoraphobia: Secondary | ICD-10-CM | POA: Diagnosis not present

## 2015-12-08 DIAGNOSIS — F4541 Pain disorder exclusively related to psychological factors: Secondary | ICD-10-CM

## 2015-12-08 MED ORDER — DIAZEPAM 10 MG PO TABS: 10.0000 mg | ORAL_TABLET | Freq: Every day | ORAL | 0 refills | Status: DC | PRN

## 2015-12-08 NOTE — Progress Notes (Signed)
Patient: Bobby [name] Male    DOB: 02/15/1986   30 y.o.   MRN: [id number] Visit Date: 12/08/2015  Today's Provider: Dortha Kernennis Chrismon, PA   Chief Complaint  Patient presents with  . Hospitalization Follow-up   Subjective:    HPI  Follow up Hospitalization  [facility] and discharged on 12/03/2015. He was treated for headache, depression and anxiety Treatment for this included no change in medications. Patient scheduled for a EGD with Propofol on 01/13/2016. He reports excellent compliance with treatment. He reports this condition is Improved.  ------------------------------------------------------------------------------------  Past Medical History:  Diagnosis Date  . ADHD (attention deficit hyperactivity disorder)   . Anxiety   . SVT (supraventricular tachycardia) (HCC)    resolved since ablation  . WPW (Wolff-Parkinson-White syndrome)    resolved since ablation   Past Surgical History:  Procedure Laterality Date  . CARDIAC CATHETERIZATION    . TONSILLECTOMY  1994  . TONSILLECTOMY     Family History  Problem Relation Age of Onset  . Breast cancer Mother   . Ovarian cancer Mother   . Alcohol abuse Father   . Drug abuse Father   . Heart attack Maternal Grandfather   . Heart attack Paternal Grandfather   . Depression Brother    Allergies  Allergen Reactions  . Brompheniramine Maleate Palpitations    Patient states cardiologist cleared him to use decongestants.  Leilani Merl. Decongestant [Oxymetazoline] Palpitations    Patient states cardiologist cleared him to use decongestants.     Previous Medications   DIAZEPAM (VALIUM) 10 MG TABLET    Take 20 mg by mouth daily.   OMEPRAZOLE (PRILOSEC) 20 MG CAPSULE    Take 1 capsule by mouth daily.   ZOLPIDEM (AMBIEN) 10 MG TABLET    Take 1 tablet (10 mg total) by mouth at bedtime.    Review of Systems  Constitutional: Negative.   Respiratory: Negative.   Cardiovascular: Negative.     Psychiatric/Behavioral: Positive for dysphoric mood. The patient is nervous/anxious.     Social History  Substance Use Topics  . Smoking status: Never Smoker  . Smokeless tobacco: Never Used  . Alcohol use No     Comment: social   Objective:   BP 108/82 (BP Location: Right Arm, Patient Position: Sitting, Cuff Size: Normal)   Pulse 68   Temp 97.8 F (36.6 C) (Oral)   Resp 14   Wt 164 lb 9.6 oz (74.7 kg)   BMI 23.62 kg/m  Wt Readings from Last 3 Encounters:  12/08/15 164 lb 9.6 oz (74.7 kg)  12/03/15 170 lb (77.1 kg)  10/16/15 168 lb (76.2 kg)   Physical Exam  Constitutional: He is oriented to person, place, and time. He appears well-developed and well-nourished. No distress.  HENT:  Head: Normocephalic and atraumatic.  Right Ear: Hearing and external ear normal.  Left Ear: Hearing and external ear normal.  Nose: Nose normal.  Eyes: Conjunctivae and lids are normal. Pupils are equal, round, and reactive to light. Right eye exhibits no discharge. Left eye exhibits no discharge. No scleral icterus.  Neck: Normal range of motion. Neck supple.  Cardiovascular: Normal rate, regular rhythm and normal heart sounds.   Pulmonary/Chest: Effort normal and breath sounds normal. No respiratory distress.  Abdominal: Soft. Bowel sounds are normal.  Musculoskeletal: Normal range of motion.  Neurological: He is alert and oriented to person, place, and time.  Skin: Skin is intact. No lesion and no rash noted.  Psychiatric: His  speech is normal. Thought content normal. His mood appears anxious. He is agitated.      Assessment & Plan:     1. Psychogenic cephalalgia Had ER visit on 12-03-15 due to severe headache with dizziness. All blood tests, EKG and scans were normal. States he has had more stress at work and family recently. May use Ibuprofen prn headache ("it stopped the headache over the weekend"). If acute headache and dizziness recurs, will need referral to neurologist. Recheck as  needed. No headache or dizziness today.  2. Panic disorder without agoraphobia Takes one Valium a day for panic attacks or severe anxiety. It helps to control symptoms. Proceed with balanced diet and exercise. May need to consider counseling with psychologist regularly. Still using Ambien to help with sleep. Feels this regimen controls symptoms well. - diazepam (VALIUM) 10 MG tablet; Take 1 tablet (10 mg total) by mouth daily as needed for anxiety.  Dispense: 30 tablet; Refill: 0

## 2016-01-08 ENCOUNTER — Encounter: Payer: Self-pay | Admitting: *Deleted

## 2016-01-08 ENCOUNTER — Emergency Department
Admission: EM | Admit: 2016-01-08 | Discharge: 2016-01-08 | Disposition: A | Discharge status: Home / Self Care | Payer: 59 | Attending: Emergency Medicine | Admitting: Emergency Medicine

## 2016-01-08 DIAGNOSIS — R002 Palpitations: Secondary | ICD-10-CM | POA: Insufficient documentation

## 2016-01-08 DIAGNOSIS — Z79899 Other long term (current) drug therapy: Secondary | ICD-10-CM | POA: Insufficient documentation

## 2016-01-08 DIAGNOSIS — F909 Attention-deficit hyperactivity disorder, unspecified type: Secondary | ICD-10-CM | POA: Insufficient documentation

## 2016-01-08 LAB — BASIC METABOLIC PANEL
Anion gap: 6 (ref 5–15)
BUN: 12 mg/dL (ref 6–20)
CALCIUM: 9.5 mg/dL (ref 8.9–10.3)
CO2: 27 mmol/L (ref 22–32)
CREATININE: 0.76 mg/dL (ref 0.61–1.24)
Chloride: 107 mmol/L (ref 101–111)
Glucose, Bld: 100 mg/dL — ABNORMAL HIGH (ref 65–99)
Potassium: 4 mmol/L (ref 3.5–5.1)
SODIUM: 140 mmol/L (ref 135–145)

## 2016-01-08 LAB — CBC
HCT: 45.4 % (ref 40.0–52.0)
Hemoglobin: 16.3 g/dL (ref 13.0–18.0)
MCH: 31.8 pg (ref 26.0–34.0)
MCHC: 35.9 g/dL (ref 32.0–36.0)
MCV: 88.3 fL (ref 80.0–100.0)
PLATELETS: 208 10*3/uL (ref 150–440)
RBC: 5.14 MIL/uL (ref 4.40–5.90)
RDW: 13 % (ref 11.5–14.5)
WBC: 6.8 10*3/uL (ref 3.8–10.6)

## 2016-01-08 LAB — TROPONIN I

## 2016-01-08 MED ORDER — PROPRANOLOL HCL 20 MG PO TABS: 20.0000 mg | ORAL_TABLET | Freq: Three times a day (TID) | ORAL | 0 refills | Status: DC

## 2016-01-08 NOTE — ED Provider Notes (Signed)
[facility] Emergency Department Provider Note  ____________________________________________  Time seen: Approximately 12:42 PM  I have reviewed the triage vital signs and the nursing notes.   HISTORY  Chief Complaint Palpitations    HPI Bobby [name] is a 30 y.o. male who complains of lightheadedness and palpitations this morning while at work. He reports he's been feeling very stressed for last couple of weeks and this morning he woke up late and headrest work without eating or drinking anything and felt even more stressed. When he told his supervisor he needed to rest for low but they decided to send him to the emergency room instead. Has a history of WPW status post ablation. Denies chest pain or lightheadedness now. Feels back to normal.     Past Medical History:  Diagnosis Date  . ADHD (attention deficit hyperactivity disorder)   . Anxiety   . SVT (supraventricular tachycardia) (HCC)    resolved since ablation  . WPW (Wolff-Parkinson-White syndrome)    resolved since ablation     Patient Active Problem List   Diagnosis Date Noted  . WPW (Wolff-Parkinson-White syndrome) 09/09/2014  . Blood in feces 09/02/2014  . Acid indigestion 09/02/2014  . ED (erectile dysfunction) of non-organic origin 09/02/2014  . Cannot sleep 09/02/2014  . Episodic paroxysmal anxiety disorder 09/02/2014  . Fast heart beat 09/02/2014  . Clicking jaw syndrome 09/02/2014  . Bilateral tinnitus 09/02/2014  . Arthralgia of shoulder 09/02/2014  . Ventricular pre-excitation with arrhythmia 09/02/2014  . History of hay fever 08/25/2014  . ADD (attention deficit disorder) 08/25/2014  . Anxiety, generalized 08/25/2014  . Insomnia, persistent 08/25/2014  . Panic disorder without agoraphobia 08/25/2014  . Anxiety 12/26/2013  . Paroxysmal supraventricular tachycardia (HCC) 10/29/2013  . Decreased libido 09/17/2009     Past Surgical History:  Procedure Laterality Date  .  CARDIAC CATHETERIZATION    . TONSILLECTOMY  1994  . TONSILLECTOMY       Prior to Admission medications   Medication Sig Start Date End Date Taking? Authorizing Provider  diazepam (VALIUM) 10 MG tablet Take 1 tablet (10 mg total) by mouth daily as needed for anxiety. 01/13/16   Jodell Cipro Chrismon, PA  omeprazole (PRILOSEC) 20 MG capsule Take 1 capsule by mouth daily. 10/16/15 10/15/16  Historical Provider, MD  propranolol (INDERAL) 20 MG tablet Take 1 tablet (20 mg total) by mouth 3 (three) times daily. 01/08/16   Sharman Cheek, MD  zolpidem (AMBIEN) 10 MG tablet Take 1 tablet (10 mg total) by mouth at bedtime. 12/03/15   Jodell Cipro Chrismon, PA     Allergies Brompheniramine maleate and Decongestant [oxymetazoline]   Family History  Problem Relation Age of Onset  . Breast cancer Mother   . Ovarian cancer Mother   . Alcohol abuse Father   . Drug abuse Father   . Heart attack Maternal Grandfather   . Heart attack Paternal Grandfather   . Depression Brother     Social History Social History  Substance Use Topics  . Smoking status: Never Smoker  . Smokeless tobacco: Never Used  . Alcohol use No     Comment: social    Review of Systems  Constitutional:   No fever or chills.  ENT:   No sore throat. No rhinorrhea. Cardiovascular:   No chest pain.Positive palpitations Respiratory:   No dyspnea or cough. Gastrointestinal:   Negative for abdominal pain, vomiting and diarrhea.  10-point ROS otherwise negative.  ____________________________________________   PHYSICAL EXAM:  VITAL SIGNS: ED  Triage Vitals [01/08/16 1014]  Enc Vitals Group     BP 126/80     Pulse Rate 86     Resp 20     Temp 98.2 F (36.8 C)     Temp Source Oral     SpO2 (!) 1 %     Weight 160 lb (72.6 kg)     Height 5\' 10"  (1.778 m)     Head Circumference      Peak Flow      Pain Score      Pain Loc      Pain Edu?      Excl. in GC?     Vital signs reviewed, nursing assessments  reviewed.   Constitutional:   Alert and oriented. Well appearing and in no distress. Eyes:   No scleral icterus. No conjunctival pallor. PERRL. EOMI.  No nystagmus. ENT   Head:   Normocephalic and atraumatic.   Nose:   No congestion/rhinnorhea. No septal hematoma   Mouth/Throat:   MMM, no pharyngeal erythema. No peritonsillar mass.    Neck:   No stridor. No SubQ emphysema. No meningismus. Hematological/Lymphatic/Immunilogical:   No cervical lymphadenopathy. Cardiovascular:   RRR. Symmetric bilateral radial and DP pulses.  No murmurs.  Respiratory:   Normal respiratory effort without tachypnea nor retractions. Breath sounds are clear and equal bilaterally. No wheezes/rales/rhonchi. Gastrointestinal:   Soft and nontender. Non distended. There is no CVA tenderness.  No rebound, rigidity, or guarding. Genitourinary:   deferred Musculoskeletal:   Nontender with normal range of motion in all extremities. No joint effusions.  No lower extremity tenderness.  No edema. Neurologic:   Normal speech and language.  CN 2-10 normal. Motor grossly intact. No gross focal neurologic deficits are appreciated.  Skin:    Skin is warm, dry and intact. No rash noted.  No petechiae, purpura, or bullae.  ____________________________________________    LABS (pertinent positives/negatives) (all labs ordered are listed, but only abnormal results are displayed) Labs Reviewed  BASIC METABOLIC PANEL - Abnormal; Notable for the following:       Result Value   Glucose, Bld 100 (*)    All other components within normal limits  CBC  TROPONIN I   ____________________________________________   EKG  Interpreted by me  Date: 01/08/2016  Rate: 73  Rhythm: normal sinus rhythm  QRS Axis: normal  Intervals: normal  ST/T Wave abnormalities: normal  Conduction Disutrbances: none  Narrative Interpretation: unremarkable      ____________________________________________     RADIOLOGY    ____________________________________________   PROCEDURES Procedures  ____________________________________________   INITIAL IMPRESSION / ASSESSMENT AND PLAN / ED COURSE  Pertinent labs & imaging results that were available during my care of the patient were reviewed by me and considered in my medical decision making (see chart for details).  Patient well appearing no acute distress. Presents with palpitations and presyncope, likely due to stress as well as not eating or drinking anything today. He is currently feeling fine. Advised follow-up with his primary care doctor next week. Do a trial of propranolol when necessary for his palpitation symptoms. No evidence of any persistent dysrhythmia.Considering the patient's symptoms, medical history, and physical examination today, I have low suspicion for ACS, PE, TAD, pneumothorax, carditis, mediastinitis, pneumonia, CHF, or sepsis.       Clinical Course   ____________________________________________   FINAL CLINICAL IMPRESSION(S) / ED DIAGNOSES  Final diagnoses:  Palpitations       Portions of this note were generated with dragon  dictation software. Dictation errors may occur despite best attempts at proofreading.    Sharman CheekPhillip Eris Breck, MD 01/08/16 1245

## 2016-01-08 NOTE — ED Triage Notes (Signed)
Pt reports increased stress at work, pt was at work, sudden onset of anxiety and heart palpitations, pt reports having a cardiac ablation in 2016, pt denies chest pain

## 2016-01-08 NOTE — ED Notes (Signed)
Patient states that this morning while he was at work he started feeling weak and the palms of his hands got really sweaty, patient states that he was also shaking. Patient denies chest pain but states that he was short of breath.   Patient states that he has been under a lot of stress recently and thinks his symptoms may be related to this.

## 2016-01-13 ENCOUNTER — Encounter: Payer: Self-pay | Admitting: Certified Registered Nurse Anesthetist

## 2016-01-13 ENCOUNTER — Ambulatory Visit: Admit: 2016-01-13 | Payer: 59 | Admitting: Unknown Physician Specialty

## 2016-01-13 SURGERY — ESOPHAGOGASTRODUODENOSCOPY (EGD) WITH PROPOFOL
Anesthesia: General

## 2016-01-15 ENCOUNTER — Ambulatory Visit (INDEPENDENT_AMBULATORY_CARE_PROVIDER_SITE_OTHER): Payer: 59 | Admitting: Family Medicine

## 2016-01-15 ENCOUNTER — Encounter: Payer: Self-pay | Admitting: Family Medicine

## 2016-01-15 VITALS — BP 110/72 | HR 88 | Temp 98.1°F | Resp 16 | Wt 164.0 lb

## 2016-01-15 DIAGNOSIS — F410 Panic disorder [episodic paroxysmal anxiety] without agoraphobia: Secondary | ICD-10-CM | POA: Diagnosis not present

## 2016-01-15 DIAGNOSIS — F988 Other specified behavioral and emotional disorders with onset usually occurring in childhood and adolescence: Secondary | ICD-10-CM | POA: Diagnosis not present

## 2016-01-15 MED ORDER — AMPHETAMINE-DEXTROAMPHETAMINE 5 MG PO TABS: ORAL_TABLET | ORAL | 0 refills | Status: DC

## 2016-01-15 NOTE — Progress Notes (Signed)
Patient: Bobby [name] Male    DOB: 07/12/1985   30 y.o.   MRN: [id number] Visit Date: 01/15/2016  Today's Provider: Dortha Kernennis Nicola Quesnell, PA   Chief Complaint  Patient presents with  . Hospitalization Follow-up    anxiety and palpatations  . Anxiety   Subjective:    HPI Pt was seen in the ER on 01/08/16 for anxiety and palpitations. He was at work and started feeling lightheaded and having palpitations. He had been really stresses and he told his supervisor he needed to sit down and they sent to the ER instead. Pt has a history of WPW and has had an ablation for this. ED thought that his symptoms were from anxiety, not eating or drinking anything that day. They did start him on Propranolol when necessary for palpitations.  Pt reports that he needs a new medication to help him deal with stress. None of the palpitations started until he started having a lot more stress in his life.     Allergies  Allergen Reactions  . Brompheniramine Maleate Palpitations    Patient states cardiologist cleared him to use decongestants.  Leilani Merl. Decongestant [Oxymetazoline] Palpitations    Patient states cardiologist cleared him to use decongestants.     Current Outpatient Prescriptions:  .  omeprazole (PRILOSEC) 20 MG capsule, Take 1 capsule by mouth daily., Disp: , Rfl:  .  zolpidem (AMBIEN) 10 MG tablet, Take 1 tablet (10 mg total) by mouth at bedtime., Disp: 30 tablet, Rfl: 0 .  diazepam (VALIUM) 10 MG tablet, Take 1 tablet (10 mg total) by mouth daily as needed for anxiety. (Patient not taking: Reported on 01/15/2016), Disp: 30 tablet, Rfl: 0 .  propranolol (INDERAL) 20 MG tablet, Take 1 tablet (20 mg total) by mouth 3 (three) times daily. (Patient not taking: Reported on 01/15/2016), Disp: 21 tablet, Rfl: 0  Review of Systems  Constitutional: Positive for fatigue.  HENT: Negative.   Eyes: Negative.   Respiratory: Negative.   Cardiovascular: Positive for palpitations.  Gastrointestinal:  Negative.   Endocrine: Negative.   Genitourinary: Negative.   Musculoskeletal: Negative.   Skin: Negative.   Allergic/Immunologic: Negative.   Neurological: Negative.   Hematological: Negative.   Psychiatric/Behavioral: The patient is nervous/anxious.     Social History  Substance Use Topics  . Smoking status: Never Smoker  . Smokeless tobacco: Never Used  . Alcohol use No     Comment: social   Objective:   BP 110/72 (BP Location: Right Arm, Patient Position: Sitting, Cuff Size: Normal)   Pulse 88   Temp 98.1 F (36.7 C) (Oral)   Resp 16   Wt 164 lb (74.4 kg)   BMI 23.53 kg/m   Physical Exam  Constitutional: He is oriented to person, place, and time. He appears well-developed and well-nourished. No distress.  HENT:  Head: Normocephalic and atraumatic.  Right Ear: Hearing normal.  Left Ear: Hearing normal.  Nose: Nose normal.  Eyes: Conjunctivae and lids are normal. Right eye exhibits no discharge. Left eye exhibits no discharge. No scleral icterus.  Neck: Neck supple.  Cardiovascular: Normal rate and regular rhythm.   Pulmonary/Chest: Effort normal and breath sounds normal. No respiratory distress.  Abdominal: Soft.  Musculoskeletal: Normal range of motion.  Neurological: He is alert and oriented to person, place, and time.  Skin: Skin is intact. No lesion and no rash noted.  Psychiatric: His speech is normal. Thought content normal. His mood appears anxious. He is agitated.  Difficulty  concentrating.      Assessment & Plan:     1. Attention deficit disorder, unspecified hyperactivity presence Having some mind racing and occasional anxiety. States he felt better and more controlled with the use of very low dose Adderall he had leftover at home. Will allow continuation of dosage used in the past. Recheck in 3 months. - amphetamine-dextroamphetamine (ADDERALL) 5 MG tablet; Take one tablet daily by mouth as needed  Dispense: 30 tablet; Refill: 0  2. Episodic  paroxysmal anxiety disorder Feel better controlled with the Adderall. Uses Ambien prn for sleep and occasional Valium for any panic attacks. Still consulting with psychologist prn. Has not had follow up with psychiatrist in the pst year. If any panic or palpitations return with use of Adderall, will need to consider switch beta blocker. Consulted for 15-20 minutes. Recheck in 3 months.       Dortha Kernennis Corgan Mormile, PA  [facility]

## 2016-04-08 ENCOUNTER — Emergency Department: Payer: 59

## 2016-04-08 ENCOUNTER — Emergency Department
Admission: EM | Admit: 2016-04-08 | Discharge: 2016-04-08 | Disposition: A | Discharge status: Home / Self Care | Payer: 59 | Attending: Emergency Medicine | Admitting: Emergency Medicine

## 2016-04-08 ENCOUNTER — Ambulatory Visit: Payer: Self-pay | Admitting: Family Medicine

## 2016-04-08 ENCOUNTER — Encounter: Payer: Self-pay | Admitting: Emergency Medicine

## 2016-04-08 DIAGNOSIS — F4320 Adjustment disorder, unspecified: Secondary | ICD-10-CM | POA: Insufficient documentation

## 2016-04-08 DIAGNOSIS — F419 Anxiety disorder, unspecified: Secondary | ICD-10-CM | POA: Insufficient documentation

## 2016-04-08 DIAGNOSIS — R0602 Shortness of breath: Secondary | ICD-10-CM | POA: Insufficient documentation

## 2016-04-08 DIAGNOSIS — Z5181 Encounter for therapeutic drug level monitoring: Secondary | ICD-10-CM | POA: Insufficient documentation

## 2016-04-08 DIAGNOSIS — F909 Attention-deficit hyperactivity disorder, unspecified type: Secondary | ICD-10-CM | POA: Insufficient documentation

## 2016-04-08 DIAGNOSIS — F4321 Adjustment disorder with depressed mood: Secondary | ICD-10-CM

## 2016-04-08 LAB — URINE DRUG SCREEN, QUALITATIVE (ARMC ONLY)
AMPHETAMINES, UR SCREEN: NOT DETECTED
BENZODIAZEPINE, UR SCRN: POSITIVE — AB
Barbiturates, Ur Screen: NOT DETECTED
COCAINE METABOLITE, UR ~~LOC~~: NOT DETECTED
Cannabinoid 50 Ng, Ur ~~LOC~~: NOT DETECTED
MDMA (Ecstasy)Ur Screen: NOT DETECTED
METHADONE SCREEN, URINE: NOT DETECTED
OPIATE, UR SCREEN: NOT DETECTED
PHENCYCLIDINE (PCP) UR S: NOT DETECTED
Tricyclic, Ur Screen: NOT DETECTED

## 2016-04-08 LAB — CBC WITH DIFFERENTIAL/PLATELET
Basophils Absolute: 0 10*3/uL (ref 0–0.1)
Basophils Relative: 0 %
Eosinophils Absolute: 0.1 10*3/uL (ref 0–0.7)
Eosinophils Relative: 1 %
HEMATOCRIT: 45 % (ref 40.0–52.0)
HEMOGLOBIN: 15.8 g/dL (ref 13.0–18.0)
LYMPHS ABS: 2.2 10*3/uL (ref 1.0–3.6)
LYMPHS PCT: 26 %
MCH: 30.8 pg (ref 26.0–34.0)
MCHC: 35.2 g/dL (ref 32.0–36.0)
MCV: 87.7 fL (ref 80.0–100.0)
MONO ABS: 0.5 10*3/uL (ref 0.2–1.0)
MONOS PCT: 5 %
NEUTROS ABS: 5.8 10*3/uL (ref 1.4–6.5)
NEUTROS PCT: 68 %
Platelets: 245 10*3/uL (ref 150–440)
RBC: 5.13 MIL/uL (ref 4.40–5.90)
RDW: 13 % (ref 11.5–14.5)
WBC: 8.5 10*3/uL (ref 3.8–10.6)

## 2016-04-08 LAB — COMPREHENSIVE METABOLIC PANEL
ALBUMIN: 4.9 g/dL (ref 3.5–5.0)
ALK PHOS: 74 U/L (ref 38–126)
ALT: 33 U/L (ref 17–63)
ANION GAP: 3 — AB (ref 5–15)
AST: 23 U/L (ref 15–41)
BUN: 14 mg/dL (ref 6–20)
CALCIUM: 9.5 mg/dL (ref 8.9–10.3)
CHLORIDE: 103 mmol/L (ref 101–111)
CO2: 32 mmol/L (ref 22–32)
CREATININE: 0.89 mg/dL (ref 0.61–1.24)
GFR calc Af Amer: >60 mL/min (ref >60)
GFR calc non Af Amer: >60 mL/min (ref >60)
GLUCOSE: 97 mg/dL (ref 65–99)
Potassium: 4.2 mmol/L (ref 3.5–5.1)
SODIUM: 138 mmol/L (ref 135–145)
Total Bilirubin: 1.8 mg/dL — ABNORMAL HIGH (ref 0.3–1.2)
Total Protein: 7.7 g/dL (ref 6.5–8.1)

## 2016-04-08 LAB — FIBRIN DERIVATIVES D-DIMER (ARMC ONLY): Fibrin derivatives D-dimer (ARMC): 90 (ref 0–499)

## 2016-04-08 LAB — BRAIN NATRIURETIC PEPTIDE: B NATRIURETIC PEPTIDE 5: 6 pg/mL (ref 0.0–100.0)

## 2016-04-08 LAB — TROPONIN I: Troponin I: <0.03 ng/mL (ref <0.03)

## 2016-04-08 MED ORDER — SODIUM CHLORIDE 0.9 % IV BOLUS (SEPSIS)
500.0000 mL | Freq: Once | INTRAVENOUS | Status: AC
  Administered 2016-04-08: 500 mL via INTRAVENOUS

## 2016-04-08 NOTE — ED Notes (Signed)
Pt ambulated down hall. sats remained above 95% on room air

## 2016-04-08 NOTE — ED Triage Notes (Signed)
Pt complains of shortness of breath for one week, denies cough congestion. Pt also complains of heart palpitations. Pt states he feels lightheaded and has a headache.

## 2016-04-08 NOTE — ED Provider Notes (Addendum)
[facility] Emergency Department Provider Note  ____________________________________________   I have reviewed the triage vital signs and the nursing notes.   HISTORY  Chief Complaint Shortness of Breath "I think I'm just anxious"   HPI Bobby [name] is a 31 y.o. male date she has a history of anxiety and he also has a remote history of SVT and IVP W but he had an ablation. He has a very unfortunate recent history. His aunt died of a expected medical illness, another family member died of a heroin overdose and another family member, his cousin, died in a car accident. All these things happened within 2 or 3 days of each other. He states since he heard the news of the first death he's been feeling short of breath and anxious. He has no other complaints. He just feels anxious and short of breath. He has no history himself or his family PE or DVT no recent travel no leg swelling. He denies orthopnea, he denies cough or chest pain. He states that it is worse when he sticking about his recent loss. He denies any nausea or vomiting. Is no back pain or other symptoms. He has not had anything that he believes to be consistent with a feet of his SVT or WPW. He has not had any significant tachycardia. He has had no flu symptoms. He has slight lightheadedness and a slight headache. That is not significant to him. He is wants to make sure he is not having angina. He wrote there weren't angina down on a piece of paper me to read to ensure that I understood the concern.       Past Medical History:  Diagnosis Date  . ADHD (attention deficit hyperactivity disorder)   . Anxiety   . SVT (supraventricular tachycardia) (HCC)    resolved since ablation  . WPW (Wolff-Parkinson-White syndrome)    resolved since ablation    Patient Active Problem List   Diagnosis Date Noted  . WPW (Wolff-Parkinson-White syndrome) 09/09/2014  . Blood in feces 09/02/2014  . Acid indigestion  09/02/2014  . ED (erectile dysfunction) of non-organic origin 09/02/2014  . Cannot sleep 09/02/2014  . Episodic paroxysmal anxiety disorder 09/02/2014  . Fast heart beat 09/02/2014  . Clicking jaw syndrome 09/02/2014  . Bilateral tinnitus 09/02/2014  . Arthralgia of shoulder 09/02/2014  . Ventricular pre-excitation with arrhythmia 09/02/2014  . History of hay fever 08/25/2014  . ADD (attention deficit disorder) 08/25/2014  . Anxiety, generalized 08/25/2014  . Insomnia, persistent 08/25/2014  . Panic disorder without agoraphobia 08/25/2014  . Anxiety 12/26/2013  . Paroxysmal supraventricular tachycardia (HCC) 10/29/2013  . Decreased libido 09/17/2009    Past Surgical History:  Procedure Laterality Date  . CARDIAC CATHETERIZATION    . TONSILLECTOMY  1994  . TONSILLECTOMY      Prior to Admission medications   Medication Sig Start Date End Date Taking? Authorizing Provider  diazepam (VALIUM) 10 MG tablet Take 1 tablet (10 mg total) by mouth daily as needed for anxiety. 01/13/16  Yes Dennis E Chrismon, PA  omeprazole (PRILOSEC) 20 MG capsule Take 1 capsule by mouth daily. 10/16/15 10/15/16 Yes Historical Provider, MD  zolpidem (AMBIEN) 10 MG tablet Take 1 tablet (10 mg total) by mouth at bedtime. 12/03/15  Yes Jodell Cipro Chrismon, PA  amphetamine-dextroamphetamine (ADDERALL) 5 MG tablet Take one tablet daily by mouth as needed 01/15/16   Jodell Cipro Chrismon, PA  propranolol (INDERAL) 20 MG tablet Take 1 tablet (20 mg total) by  mouth 3 (three) times daily. Patient not taking: Reported on 01/15/2016 01/08/16   Sharman Cheek, MD    Allergies Brompheniramine maleate and Decongestant [oxymetazoline]  Family History  Problem Relation Age of Onset  . Breast cancer Mother   . Ovarian cancer Mother   . Alcohol abuse Father   . Drug abuse Father   . Heart attack Maternal Grandfather   . Heart attack Paternal Grandfather   . Depression Brother     Social History Social History   Substance Use Topics  . Smoking status: Never Smoker  . Smokeless tobacco: Never Used  . Alcohol use No     Comment: social    Review of Systems Constitutional: No fever/chills Eyes: No visual changes. ENT: No sore throat. No stiff neck no neck pain Cardiovascular: Denies chest pain. Respiratory: + shortness of breath. Gastrointestinal:   no vomiting.  No diarrhea.  No constipation. Genitourinary: Negative for dysuria. Musculoskeletal: Negative lower extremity swelling Skin: Negative for rash. Neurological: Negative for severe headaches, focal weakness or numbness. 10-point ROS otherwise negative.  ____________________________________________   PHYSICAL EXAM:  VITAL SIGNS: ED Triage Vitals  Enc Vitals Group     BP 04/08/16 1106 121/87     Pulse Rate 04/08/16 1106 84     Resp 04/08/16 1106 18     Temp 04/08/16 1106 98.3 F (36.8 C)     Temp Source 04/08/16 1106 Oral     SpO2 04/08/16 1106 100 %     Weight 04/08/16 1107 155 lb (70.3 kg)     Height 04/08/16 1107 5\' 10"  (1.778 m)     Head Circumference --      Peak Flow --      Pain Score 04/08/16 1107 8     Pain Loc --      Pain Edu? --      Excl. in GC? --     Constitutional: Alert and oriented. Well appearing and in no acute distress. Eyes: Conjunctivae are normal. PERRL. EOMI. Head: Atraumatic. Nose: No congestion/rhinnorhea. Mouth/Throat: Mucous membranes are moist.  Oropharynx non-erythematous. Neck: No stridor.   Nontender with no meningismus Cardiovascular: Normal rate, regular rhythm. Grossly normal heart sounds.  Good peripheral circulation. Respiratory: Normal respiratory effort.  No retractions. Lungs CTAB. Abdominal: Soft and nontender. No distention. No guarding no rebound Back:  There is no focal tenderness or step off.  there is no midline tenderness there are no lesions noted. there is no CVA tenderness Musculoskeletal: No lower extremity tenderness, no upper extremity tenderness. No joint  effusions, no DVT signs strong distal pulses no edema Neurologic:  Normal speech and language. No gross focal neurologic deficits are appreciated.  Skin:  Skin is warm, dry and intact. No rash noted. Psychiatric: Mood and affect are anxious. Speech and behavior are normal.  ____________________________________________   LABS (all labs ordered are listed, but only abnormal results are displayed)  Labs Reviewed  COMPREHENSIVE METABOLIC PANEL - Abnormal; Notable for the following:       Result Value   Total Bilirubin 1.8 (*)    Anion gap 3 (*)    All other components within normal limits  URINE DRUG SCREEN, QUALITATIVE ([facility] ONLY) - Abnormal; Notable for the following:    Benzodiazepine, Ur Scrn POSITIVE (*)    All other components within normal limits  CBC WITH DIFFERENTIAL/PLATELET  TROPONIN I  BRAIN NATRIURETIC PEPTIDE  FIBRIN DERIVATIVES D-DIMER ([facility] ONLY)   ____________________________________________  EKG  I personally interpreted any EKGs ordered by  me or triage Normal sinus rhythm at 78 bpm no acute ST elevation or acute ST depression normal axis unremarkable EKG ____________________________________________  RADIOLOGY  I reviewed any imaging ordered by me or triage that were performed during my shift and, if possible, patient and/or family made aware of any abnormal findings. ____________________________________________   PROCEDURES  Procedure(s) performed: None  Procedures  Critical Care performed: None  ____________________________________________   INITIAL IMPRESSION / ASSESSMENT AND PLAN / ED COURSE  Pertinent labs & imaging results that were available during my care of the patient were reviewed by me and considered in my medical decision making (see chart for details).  Patient with a sensation of shortness of breath that began within minutes of hearing about the death of his on and persisted through multiple other family injury. Patient himself is  very well-appearing. He does not have any risk factors for PE he is perk negative and he is a negative d-dimer, I don't think a presented PE. No evidence of ACS or dissection. No evidence of pneumonia or pneumothorax. No evidence of dysrhythmia. No evidence of pneumothorax. No evidence of myocarditis or endocarditis. Low suspicion for takotsubo or acute grief related cardiac issue. Sats are 100% vital signs are normal. We will provide him with reassurance. Extensive return precautions and follow up given understood. He didn't think he meets criteria for admission to the hospital nor at this time is or any indication for CT scan, as I think the risk of radiation likely outweighs any possible benefit.  ----------------------------------------- 3:54 PM on 04/08/2016 -----------------------------------------  Very comfortable with discharge, he is very relieved by her findings. Extensive return precautions again reviewed with patient. I did offer psychiatric counseling here, but he declines. He states he'll follow-up if he needs it. He has no SI or HI.    ____________________________________________   FINAL CLINICAL IMPRESSION(S) / ED DIAGNOSES  Final diagnoses:  None      This chart was dictated using voice recognition software.  Despite best efforts to proofread,  errors can occur which can change meaning.      Jeanmarie PlantJames A McShane, MD 04/08/16 1544    Jeanmarie PlantJames A McShane, MD 04/08/16 [phone]

## 2016-04-15 ENCOUNTER — Ambulatory Visit (INDEPENDENT_AMBULATORY_CARE_PROVIDER_SITE_OTHER): Payer: 59 | Admitting: Family Medicine

## 2016-04-15 ENCOUNTER — Encounter: Payer: Self-pay | Admitting: Family Medicine

## 2016-04-15 VITALS — BP 106/84 | HR 92 | Temp 98.4°F | Resp 16 | Wt 172.6 lb

## 2016-04-15 DIAGNOSIS — I471 Supraventricular tachycardia, unspecified: Secondary | ICD-10-CM

## 2016-04-15 DIAGNOSIS — R0602 Shortness of breath: Secondary | ICD-10-CM | POA: Diagnosis not present

## 2016-04-15 DIAGNOSIS — F410 Panic disorder [episodic paroxysmal anxiety] without agoraphobia: Secondary | ICD-10-CM

## 2016-04-15 NOTE — Progress Notes (Signed)
Patient: Bobby [name] Male    DOB: 02/11/1986   31 y.o.   MRN: [id number] Visit Date: 04/15/2016  Today's Provider: Dortha Kernennis Chrismon, PA   Chief Complaint  Patient presents with  . Anxiety  . Follow-up   Subjective:    HPI Anxiety: Patient is here for 3 month follow up. He was seen at [facility] ED on 04/08/2016 for increased anxiety and SOB. Symptoms increased recently due to three family members dying. Patient reports good compliance with treatment plan.  Patient was referred to cardiologist Dr. Juliann Paresallwood by ER provider. He was prescribed Metoprolol. but does not want to take medication. He has a appointment scheduled with pulmonology for SOB on 04/25/2016.  Past Medical History:  Diagnosis Date  . ADHD (attention deficit hyperactivity disorder)   . Anxiety   . SVT (supraventricular tachycardia) (HCC)    resolved since ablation  . WPW (Wolff-Parkinson-White syndrome)    resolved since ablation   Past Surgical History:  Procedure Laterality Date  . CARDIAC CATHETERIZATION    . TONSILLECTOMY  1994  . TONSILLECTOMY     Family History  Problem Relation Age of Onset  . Breast cancer Mother   . Ovarian cancer Mother   . Alcohol abuse Father   . Drug abuse Father   . Heart attack Maternal Grandfather   . Heart attack Paternal Grandfather   . Depression Brother    Allergies  Allergen Reactions  . Brompheniramine Maleate Palpitations    Patient states cardiologist cleared him to use decongestants.  Leilani Merl. Decongestant [Oxymetazoline] Palpitations    Patient states cardiologist cleared him to use decongestants.     Previous Medications   AMPHETAMINE-DEXTROAMPHETAMINE (ADDERALL) 5 MG TABLET    Take one tablet daily by mouth as needed   DIAZEPAM (VALIUM) 10 MG TABLET    Take 1 tablet (10 mg total) by mouth daily as needed for anxiety.   METOPROLOL SUCCINATE (TOPROL-XL) 25 MG 24 HR TABLET       OMEPRAZOLE (PRILOSEC) 20 MG CAPSULE    Take 1 capsule by mouth daily.   PROPRANOLOL (INDERAL)  20 MG TABLET    Take 1 tablet (20 mg total) by mouth 3 (three) times daily.   ZOLPIDEM (AMBIEN) 10 MG TABLET    Take 1 tablet (10 mg total) by mouth at bedtime.    Review of Systems  Constitutional: Negative.   Respiratory: Negative.   Cardiovascular: Negative.   Psychiatric/Behavioral: The patient is nervous/anxious.     Social History  Substance Use Topics  . Smoking status: Never Smoker  . Smokeless tobacco: Never Used  . Alcohol use No     Comment: social   Objective:   BP 106/84 (BP Location: Right Arm, Patient Position: Sitting, Cuff Size: Normal)   Pulse 92   Temp 98.4 F (36.9 C) (Oral)   Resp 16   Wt 172 lb 9.6 oz (78.3 kg)   SpO2 97%   BMI 24.77 kg/m   Physical Exam  Constitutional: He is oriented to person, place, and time. He appears well-developed and well-nourished. No distress.  HENT:  Head: Normocephalic and atraumatic.  Right Ear: Hearing normal.  Left Ear: Hearing normal.  Nose: Nose normal.  Eyes: Conjunctivae and lids are normal. Right eye exhibits no discharge. Left eye exhibits no discharge. No scleral icterus.  Neck: Neck supple. No thyromegaly present.  Cardiovascular: Normal rate, regular rhythm and normal heart sounds.   Pulmonary/Chest: Effort normal and breath sounds normal. No respiratory distress. He has no  wheezes. He has no rales.  Abdominal: Bowel sounds are normal.  Musculoskeletal: Normal range of motion.  Neurological: He is alert and oriented to person, place, and time.  Skin: Skin is intact. No lesion and no rash noted.  Psychiatric: His speech is normal. His mood appears anxious. He is agitated. He expresses no homicidal ideation. He expresses no suicidal plans.      Assessment & Plan:      1. Paroxysmal supraventricular tachycardia (HCC) Evaluated at the ER on 04-08-16 with a severe sensation of tachycardia and shortness of breath. Labs (including drug screen - negative except Benzodiazepine he used for panic) and EKG were  normal. He was scheduled to see a cardiologist (Dr. Juliann Pares) for further evaluation with Holter. Awaiting final report of Holter Monitor for 48 hours. Follow up with cardiologist.  2. Panic disorder without agoraphobia Flares with the death of his aunt (expected medical issues), cousin (auto accident) and another family member (heroin overdose) over the past 4-6 weeks. Tried the Valium during the funeral but did not get any help with panic or tachycardia. States he won't take any more. Encouraged to use the Metoprolol 12,5 mg BID that Dr. Juliann Pares prescribed.  3. Shortness of breath No shortness of breath, cough or wheeze at the present. Suspect secondary to panic with tachycardia. Has an appointment for pulmonology evaluation as scheduled by Dr. Juliann Pares next week. Proceed with this appointment.

## 2016-04-15 NOTE — Patient Instructions (Signed)
Supraventricular Tachycardia, Adult °Supraventricular tachycardia (SVT) is a type of abnormal heart rhythm. It causes the heart to beat very quickly and then return to a normal speed. °A normal heart rate is 60-100 beats per minute. During an episode of SVT, your heart rate may be higher than 150 beats per minute. Episodes of SVT can be frightening, but they are usually not dangerous. However, if episodes happens often or last for long periods of time, they may lead to heart failure. °What are the causes? °Usually, a normal heartbeat starts when an area called the sinoatrial node releases an electrical signal. In SVT, other areas of the heart send out electrical signals that interfere with the signal from the sinoatrial node. °It is not known why some people get SVT and others do not. °What increases the risk? °This condition is more likely to develop in: °· People who are 12?30 years old. °· Women. ° °Factors that may increase your chances of an attack include: °· Stress. °· Tiredness. °· Smoking. °· Stimulant drugs, such as cocaine and methamphetamine. °· Alcohol. °· Caffeine. °· Pregnancy. °· Anxiety. ° °What are the signs or symptoms? °Symptoms of this condition include: °· A pounding heart. °· A feeling that the heart is skipping beats (palpitations). °· Weakness. °· Shortness of breath. °· Tightness or pain in your chest. °· Light-headedness. °· Anxiety. °· Dizziness. °· Sweating. °· Nausea. °· Fainting. °· Fatigue or tiredness. ° °A mild episode may not cause symptoms. °How is this diagnosed? °This condition may be diagnosed based on: °· Your symptoms. °· A physical exam. If you are have an episode of SVT during the exam, the health care provider may be able to diagnose SVT by listening to your heart and feeling your pulse. °· Tests. These may include: °? An electrocardiogram (ECG). This test is done to check for problems with electrical activity in the heart. °? A Holter monitor or event monitor test. This  test involves wearing a portable device that monitors your heart rate over time. °? An echocardiogram. This test involves taking an image of your heart using sound waves. It is done to rule out other causes of a fast heart rate. °? Blood tests. ° °How is this treated? °This condition may be treated with: °· Vagal nerve stimulation. The treatment involves stimulating your vagus nerve, which slows down the heart. It is often the first and only treatment that is needed for this condition. It is a good idea to try the several ways of doing vagal stimulation to find which one works best for you. Ways to do this treatment include: °? Holding your breath and pushing, as though you are having a bowel movement. °? Massaging an area on one side of your neck, below your jaw. Do not try this yourself. Only a health care provider should do this. If done the wrong way, it can lead to a stroke. °? Bending forward with your head between your legs. °? Coughing while bending forward with your head between your legs. °? Closing your eyes and massaging your eyeballs. A health care provider should guide you through this method before you try it on your own. °· Medicines that prevent attacks. °· Medicine to stop an attack. The medicine is given through an IV tube at the hospital. °· A small electric shock (cardioversion) that stops an attack. Before you get the shock, you will get medicine to make you fall asleep. °· Radiofrequency ablation. In this procedure, a small, thin tube (catheter)   is used to send radiofrequency energy to the area of tissue that is causing the rapid heartbeats. The energy kills the cells and helps your heart keep a normal rhythm. You may have this treatment if you have symptoms of SVT often. ° °If you do not have symptoms, you may not need treatment. °Follow these instructions at home: °Stress °· Avoid stressful situations when possible. °· Find healthy ways of managing stress that work for you. Some healthy ways  to manage stress include: °? Taking part in relaxing activities, such as yoga, meditation, or being out in nature. °? Listening to relaxing music. °? Practicing relaxation techniques, such as deep breathing. °? Leading a healthy lifestyle. This involves getting plenty of sleep, exercising, and eating a balanced diet. °? Attending counseling or talk therapy with a mental health professional. °Sleep °· Try to get at least 7 hours of sleep each night. °Tobacco and nicotine °· Do not use any products that contain nicotine or tobacco, such as cigarettes and e-cigarettes. If you need help quitting, ask your health care provider. °Alcohol °· If alcohol triggers episodes of SVT, do not drink alcohol. °· If alcohol does not seem to trigger episodes, limit alcohol intake to no more than 1 drink a day for nonpregnant women and 2 drinks a day for men. One drink equals 12 oz of beer, 5 oz of wine, or 1½ oz of hard liquor. °Caffeine °· If caffeine triggers episodes of SVT, do not eat, drink, or use anything with caffeine in it. °· If caffeine does not seem to trigger episodes, consume caffeine in moderation. °Stimulant drugs °· Do not use stimulant drugs. If you need help quitting, talk with your health care provider. °General instructions °· Maintain a healthy weight. °· Exercise regularly. Ask your health care provider to suggest some good activities for you. Aim for one or a combination of the following: °? 150 minutes per week of moderate exercise, such as walking or yoga. °? 75 minutes per week of vigorous exercise, such as running or swimming. °· Perform vagus nerve stimulation as directed by your health care provider. °· Take over-the-counter and prescription medicines only as told by your health care provider. °Contact a health care provider if: °· You have episodes of SVT more often than before. °· Episodes of SVT last longer than before. °· Vagus nerve stimulation is no longer helping. °· You have new symptoms. °Get  help right away if: °· You have chest pain. °· Your symptoms get worse. °· You have trouble breathing. °· You have an episode of SVT that lasts longer than 20 minutes. °· You faint. °These symptoms may represent a serious problem that is an emergency. Do not wait to see if the symptoms will go away. Get medical help right away. Call your local emergency services (911 in the [place] Do not drive yourself to the hospital. °This information is not intended to replace advice given to you by your health care provider. Make sure you discuss any questions you have with your health care provider. °Document Released: 02/21/2005 Document Revised: 10/29/2015 Document Reviewed: 10/29/2015 °Elsevier Interactive Patient Education © 2017 [employer] ° °

## 2016-05-31 ENCOUNTER — Other Ambulatory Visit: Payer: Self-pay | Admitting: Family Medicine

## 2016-05-31 DIAGNOSIS — F410 Panic disorder [episodic paroxysmal anxiety] without agoraphobia: Secondary | ICD-10-CM

## 2016-05-31 DIAGNOSIS — G4700 Insomnia, unspecified: Secondary | ICD-10-CM

## 2016-05-31 NOTE — Telephone Encounter (Signed)
[facility].

## 2016-05-31 NOTE — Telephone Encounter (Signed)
Phone in refills of these medications for prn use for insomnia and panic disorder with agoraphobia.

## 2016-06-18 ENCOUNTER — Emergency Department
Admission: EM | Admit: 2016-06-18 | Discharge: 2016-06-18 | Disposition: A | Discharge status: Home / Self Care | Payer: 59 | Attending: Emergency Medicine | Admitting: Emergency Medicine

## 2016-06-18 ENCOUNTER — Emergency Department: Payer: 59

## 2016-06-18 ENCOUNTER — Encounter: Payer: Self-pay | Admitting: Emergency Medicine

## 2016-06-18 DIAGNOSIS — R110 Nausea: Secondary | ICD-10-CM | POA: Diagnosis present

## 2016-06-18 DIAGNOSIS — K219 Gastro-esophageal reflux disease without esophagitis: Secondary | ICD-10-CM | POA: Diagnosis not present

## 2016-06-18 DIAGNOSIS — Z79899 Other long term (current) drug therapy: Secondary | ICD-10-CM | POA: Insufficient documentation

## 2016-06-18 LAB — CBC WITH DIFFERENTIAL/PLATELET
BASOS ABS: 0 10*3/uL (ref 0–0.1)
Basophils Relative: 0 %
Eosinophils Absolute: 0.2 10*3/uL (ref 0–0.7)
Eosinophils Relative: 1 %
HCT: 43.6 % (ref 40.0–52.0)
HEMOGLOBIN: 15.4 g/dL (ref 13.0–18.0)
LYMPHS ABS: 2 10*3/uL (ref 1.0–3.6)
LYMPHS PCT: 15 %
MCH: 31.2 pg (ref 26.0–34.0)
MCHC: 35.2 g/dL (ref 32.0–36.0)
MCV: 88.5 fL (ref 80.0–100.0)
Monocytes Absolute: 0.9 10*3/uL (ref 0.2–1.0)
Monocytes Relative: 7 %
Neutro Abs: 10.5 10*3/uL — ABNORMAL HIGH (ref 1.4–6.5)
Neutrophils Relative %: 77 %
PLATELETS: 258 10*3/uL (ref 150–440)
RBC: 4.93 MIL/uL (ref 4.40–5.90)
RDW: 13 % (ref 11.5–14.5)
WBC: 13.6 10*3/uL — AB (ref 3.8–10.6)

## 2016-06-18 LAB — COMPREHENSIVE METABOLIC PANEL
ALT: 40 U/L (ref 17–63)
ANION GAP: 4 — AB (ref 5–15)
AST: 23 U/L (ref 15–41)
Albumin: 4.3 g/dL (ref 3.5–5.0)
Alkaline Phosphatase: 77 U/L (ref 38–126)
BILIRUBIN TOTAL: 1.2 mg/dL (ref 0.3–1.2)
BUN: 13 mg/dL (ref 6–20)
CHLORIDE: 105 mmol/L (ref 101–111)
CO2: 31 mmol/L (ref 22–32)
Calcium: 9.5 mg/dL (ref 8.9–10.3)
Creatinine, Ser: 0.83 mg/dL (ref 0.61–1.24)
GFR calc non Af Amer: >60 mL/min (ref >60)
Glucose, Bld: 94 mg/dL (ref 65–99)
POTASSIUM: 4.2 mmol/L (ref 3.5–5.1)
Sodium: 140 mmol/L (ref 135–145)
TOTAL PROTEIN: 7.1 g/dL (ref 6.5–8.1)

## 2016-06-18 LAB — LIPASE, BLOOD: LIPASE: 19 U/L (ref 11–51)

## 2016-06-18 MED ORDER — SUCRALFATE 1 GM/10ML PO SUSP: 1.0000 g | Freq: Four times a day (QID) | ORAL | 1 refills | Status: DC

## 2016-06-18 MED ORDER — FAMOTIDINE IN NACL 20-0.9 MG/50ML-% IV SOLN
20.0000 mg | Freq: Once | INTRAVENOUS | Status: AC
  Administered 2016-06-18: 20 mg via INTRAVENOUS
  Filled 2016-06-18: qty 50

## 2016-06-18 MED ORDER — ONDANSETRON 4 MG PO TBDP
ORAL_TABLET | ORAL | Status: AC
  Filled 2016-06-18: qty 1

## 2016-06-18 MED ORDER — ONDANSETRON 4 MG PO TBDP
4.0000 mg | ORAL_TABLET | Freq: Once | ORAL | Status: AC
  Administered 2016-06-18: 4 mg via ORAL

## 2016-06-18 MED ORDER — GI COCKTAIL ~~LOC~~
30.0000 mL | Freq: Once | ORAL | Status: AC
  Administered 2016-06-18: 30 mL via ORAL
  Filled 2016-06-18: qty 30

## 2016-06-18 MED ORDER — FAMOTIDINE 20 MG PO TABS: 20.0000 mg | ORAL_TABLET | Freq: Two times a day (BID) | ORAL | 0 refills | Status: DC

## 2016-06-18 NOTE — ED Notes (Signed)
AAOx3.  Skin warm and dry.  NAD 

## 2016-06-18 NOTE — ED Notes (Signed)
Pepcid drip not quite finished yet. Pt working on getting a ride home, no c/o voiced, respirations equal and unlabored.

## 2016-06-18 NOTE — ED Notes (Signed)
Ambulatory to stat desk by EMS.  EMS reports patient woke at approximately 2 am because felt like he wasn't breathing and checked his oxygen level and it was 88% but then went up.  EMS reports 98-100% enroute to ED.  BP 124/80; HR 100; CBG - 107.  EMS reports lungs sounds clear bilaterally.

## 2016-06-18 NOTE — ED Notes (Signed)
Changed patient  acuity to 3 from 4 due to labs and IV.

## 2016-06-18 NOTE — ED Notes (Signed)
Pt. States he was awakened at around 1 am in the morning with shortness of breath and palpitations.  Pt. States no change in medication.  Pt. Comfortable in room at this time.

## 2016-06-18 NOTE — ED Provider Notes (Addendum)
[facility] Emergency Department Provider Note   ____________________________________________   First MD Initiated Contact with Patient 06/18/16 [phone]     (approximate)  I have reviewed the triage vital signs and the nursing notes.   HISTORY  Chief Complaint Gastroesophageal Reflux    HPI Bobby [name] is a 31 y.o. male brought to the ED from home via EMS with a chief complaint of reflux. Patient has a history of GERD on omeprazole 20 mg daily. Admits he ate a lot of pizza last evening and awoke with severe, burning pain to his epigastric and esophageal area. Felt like he was choking, shortness of breath and going to vomit but did not vomit. Has a pulse ox at home and reports room air saturations were 88% briefly before increasing to normal. Denies recent fever, chills, chest pain, diarrhea. Denies recent travel or trauma. Did not receive medications prior to arrival.   Past Medical History:  Diagnosis Date  . ADHD (attention deficit hyperactivity disorder)   . Anxiety   . SVT (supraventricular tachycardia) (HCC)    resolved since ablation  . WPW (Wolff-Parkinson-White syndrome)    resolved since ablation    Patient Active Problem List   Diagnosis Date Noted  . WPW (Wolff-Parkinson-White syndrome) 09/09/2014  . Blood in feces 09/02/2014  . Acid indigestion 09/02/2014  . ED (erectile dysfunction) of non-organic origin 09/02/2014  . Cannot sleep 09/02/2014  . Episodic paroxysmal anxiety disorder 09/02/2014  . Fast heart beat 09/02/2014  . Clicking jaw syndrome 09/02/2014  . Bilateral tinnitus 09/02/2014  . Arthralgia of shoulder 09/02/2014  . Ventricular pre-excitation with arrhythmia 09/02/2014  . History of hay fever 08/25/2014  . ADD (attention deficit disorder) 08/25/2014  . Anxiety, generalized 08/25/2014  . Insomnia, persistent 08/25/2014  . Panic disorder without agoraphobia 08/25/2014  . Anxiety 12/26/2013  . Paroxysmal  supraventricular tachycardia (HCC) 10/29/2013  . Decreased libido 09/17/2009    Past Surgical History:  Procedure Laterality Date  . CARDIAC CATHETERIZATION    . TONSILLECTOMY  1994  . TONSILLECTOMY      Prior to Admission medications   Medication Sig Start Date End Date Taking? Authorizing Provider  amphetamine-dextroamphetamine (ADDERALL) 5 MG tablet Take one tablet daily by mouth as needed 01/15/16   Jodell Cipro Chrismon, PA  diazepam (VALIUM) 10 MG tablet TAKE 1 TABLET BY MOUTH DAILY AS NEEDED FOR ANXIETY 05/31/16   Jodell Cipro Chrismon, PA  famotidine (PEPCID) 20 MG tablet Take 1 tablet (20 mg total) by mouth 2 (two) times daily. 06/18/16   Irean Hong, MD  metoprolol succinate (TOPROL-XL) 25 MG 24 hr tablet  04/11/16   Historical Provider, MD  omeprazole (PRILOSEC) 20 MG capsule Take 1 capsule by mouth daily. 10/16/15 10/15/16  Historical Provider, MD  propranolol (INDERAL) 20 MG tablet Take 1 tablet (20 mg total) by mouth 3 (three) times daily. Patient not taking: Reported on 04/15/2016 01/08/16   Sharman Cheek, MD  sucralfate (CARAFATE) 1 GM/10ML suspension Take 10 mLs (1 g total) by mouth 4 (four) times daily. 06/18/16   Irean Hong, MD  zolpidem (AMBIEN) 10 MG tablet TAKE 1 TABLET BY MOUTH EVERY NIGHT AT BEDTIME 05/31/16   Jodell Cipro Chrismon, PA    Allergies Brompheniramine maleate and Decongestant [oxymetazoline]  Family History  Problem Relation Age of Onset  . Breast cancer Mother   . Ovarian cancer Mother   . Alcohol abuse Father   . Drug abuse Father   . Heart attack Maternal  Grandfather   . Heart attack Paternal Grandfather   . Depression Brother     Social History Social History  Substance Use Topics  . Smoking status: Never Smoker  . Smokeless tobacco: Never Used  . Alcohol use No     Comment: social    Review of Systems  Constitutional: No fever/chills.  Eyes: No visual changes. ENT: No sore throat. Cardiovascular: Denies chest pain. Respiratory: Denies  shortness of breath. Gastrointestinal: Positive for burning abdominal pain.  Positive for nausea, no vomiting.  No diarrhea.  No constipation. Genitourinary: Negative for dysuria. Musculoskeletal: Negative for back pain. Skin: Negative for rash. Neurological: Negative for headaches, focal weakness or numbness.  10-point ROS otherwise negative.  ____________________________________________   PHYSICAL EXAM:  VITAL SIGNS: ED Triage Vitals  Enc Vitals Group     BP 06/18/16 0305 131/85     Pulse Rate 06/18/16 0305 92     Resp 06/18/16 0305 20     Temp 06/18/16 0305 98.6 F (37 C)     Temp Source 06/18/16 0305 Oral     SpO2 06/18/16 0305 99 %     Weight 06/18/16 0306 160 lb (72.6 kg)     Height 06/18/16 0306  (1.778 m)     Head Circumference --      Peak Flow --      Pain Score 06/18/16 0305 4     Pain Loc --      Pain Edu? --      Excl. in GC? --     Constitutional: Asleep, awakened for exam. Alert and oriented. Well appearing and in no acute distress. Eyes: Conjunctivae are normal. PERRL. EOMI. Head: Atraumatic. Nose: No congestion/rhinnorhea. Mouth/Throat: Mucous membranes are moist.  Oropharynx non-erythematous. Neck: No stridor.   Cardiovascular: Normal rate, regular rhythm. Grossly normal heart sounds.  Good peripheral circulation. Respiratory: Normal respiratory effort.  No retractions. Lungs CTAB. Gastrointestinal: Soft and nontender to light and deep palpation. No distention. No abdominal bruits. No CVA tenderness. Musculoskeletal: No lower extremity tenderness nor edema.  No joint effusions. Neurologic:  Normal speech and language. No gross focal neurologic deficits are appreciated. No gait instability. Skin:  Skin is warm, dry and intact. No rash noted. Psychiatric: Mood and affect are normal. Speech and behavior are normal.  ____________________________________________   LABS (all labs ordered are listed, but only abnormal results are displayed)  Labs  Reviewed  CBC WITH DIFFERENTIAL/PLATELET - Abnormal; Notable for the following:       Result Value   WBC 13.6 (*)    Neutro Abs 10.5 (*)    All other components within normal limits  COMPREHENSIVE METABOLIC PANEL - Abnormal; Notable for the following:    Anion gap 4 (*)    All other components within normal limits  LIPASE, BLOOD   ____________________________________________  EKG  None ____________________________________________  RADIOLOGY  Chest 2 view interpreted per Dr. Chase Picket: No active cardiopulmonary disease. ____________________________________________   PROCEDURES  Procedure(s) performed: None  Procedures  Critical Care performed: No  ____________________________________________   INITIAL IMPRESSION / ASSESSMENT AND PLAN / ED COURSE  Pertinent labs & imaging results that were available during my care of the patient were reviewed by me and considered in my medical decision making (see chart for details).  31 year old male with GERD who presents with reflux symptoms. Patient received GI cocktail prior to arriving to treatment room. Reports improvement in symptoms but some burning persists. Will check LFTs, lipase and administer IV Pepcid.  Clinical Course  as of Jun 19 711  Sat Jun 18, 2016  0712 Updated patient of laboratory and imaging results. He is feeling better after IV Pepcid. Will discharge home with prescriptions for Carafate and Pepcid. Strict return precautions given. Patient verbalizes understanding and agrees with plan of care.  [JS]    Clinical Course User Index [JS] Irean Hong, MD     ____________________________________________   FINAL CLINICAL IMPRESSION(S) / ED DIAGNOSES  Final diagnoses:  Gastroesophageal reflux disease, esophagitis presence not specified      NEW MEDICATIONS STARTED DURING THIS VISIT:  New Prescriptions   FAMOTIDINE (PEPCID) 20 MG TABLET    Take 1 tablet (20 mg total) by mouth 2 (two) times daily.    SUCRALFATE (CARAFATE) 1 GM/10ML SUSPENSION    Take 10 mLs (1 g total) by mouth 4 (four) times daily.     Note:  This document was prepared using Dragon voice recognition software and may include unintentional dictation errors.    Irean Hong, MD 06/18/16 1610    Irean Hong, MD 06/18/16 [phone]

## 2016-06-18 NOTE — Discharge Instructions (Signed)
1. Continue omeprazole daily. Add Pepcid 20 mg twice daily and Carafate 10mL before meals and at bedtime. 2. Eat a bland diet as possible and avoid spicy foods and alcohol. 3. Return to the ER for worsening symptoms, persistent vomiting, difficulty breathing or other concerns.

## 2016-06-18 NOTE — ED Triage Notes (Signed)
Pt states that he went to sleep last night and woke up with severe reflux. Pt reports that he felt like he was going to vomit but was unable to at the time. Pt also states that he felt choked and SOB at the time. Pt is ambulatory to triage with bilateral clear lung sounds and is in NAD at this time.

## 2016-07-19 ENCOUNTER — Ambulatory Visit (INDEPENDENT_AMBULATORY_CARE_PROVIDER_SITE_OTHER): Payer: 59 | Admitting: Family Medicine

## 2016-07-19 ENCOUNTER — Other Ambulatory Visit: Payer: Self-pay | Admitting: Family Medicine

## 2016-07-19 ENCOUNTER — Encounter: Payer: Self-pay | Admitting: Family Medicine

## 2016-07-19 VITALS — BP 100/64 | HR 104 | Temp 97.9°F | Resp 16 | Wt 184.0 lb

## 2016-07-19 DIAGNOSIS — Z5289 Donor of other specified organs or tissues: Secondary | ICD-10-CM

## 2016-07-19 DIAGNOSIS — K921 Melena: Secondary | ICD-10-CM | POA: Diagnosis not present

## 2016-07-19 MED ORDER — HYDROCORTISONE ACETATE 25 MG RE SUPP: 25.0000 mg | Freq: Two times a day (BID) | RECTAL | 0 refills | Status: DC

## 2016-07-19 NOTE — Progress Notes (Signed)
Patient: Bobby [name] Male    DOB: 1986-02-02   31 y.o.   MRN: [id number] Visit Date: 07/19/2016  Today's Provider: Dortha Kern, PA   Chief Complaint  Patient presents with  . Rectal Bleeding   Subjective:    Rectal Bleeding   Episode onset: Recurrent; has been seen for this in the past and was diagnosed with a fissure. Has reoccured last week, but pt has not noticed the bleeding this week. The problem has been gradually worsening. The patient is experiencing no pain. Associated symptoms include abdominal pain (seeing GI for upper abdominal pain; is being treated with Protonix and Carafate). Pertinent negatives include no anorexia, no fever, no diarrhea, no hematemesis, no hemorrhoids, no nausea, no rectal pain, no vomiting, no hematuria, no chest pain, no headaches and no coughing.    Patient Active Problem List   Diagnosis Date Noted  . WPW (Wolff-Parkinson-White syndrome) 09/09/2014  . Blood in feces 09/02/2014  . Acid indigestion 09/02/2014  . ED (erectile dysfunction) of non-organic origin 09/02/2014  . Cannot sleep 09/02/2014  . Episodic paroxysmal anxiety disorder 09/02/2014  . Fast heart beat 09/02/2014  . Clicking jaw syndrome 09/02/2014  . Bilateral tinnitus 09/02/2014  . Arthralgia of shoulder 09/02/2014  . Ventricular pre-excitation with arrhythmia 09/02/2014  . History of hay fever 08/25/2014  . ADD (attention deficit disorder) 08/25/2014  . Anxiety, generalized 08/25/2014  . Insomnia, persistent 08/25/2014  . Panic disorder without agoraphobia 08/25/2014  . Anxiety 12/26/2013  . Paroxysmal supraventricular tachycardia (HCC) 10/29/2013  . Decreased libido 09/17/2009   Past Surgical History:  Procedure Laterality Date  . CARDIAC CATHETERIZATION    . TONSILLECTOMY  1994  . TONSILLECTOMY     Family History  Problem Relation Age of Onset  . Breast cancer Mother   . Ovarian cancer Mother   . Alcohol abuse Father   . Drug abuse Father   . Heart  attack Maternal Grandfather   . Heart attack Paternal Grandfather   . Depression Brother    No Known Allergies  Current Outpatient Prescriptions:  .  diazepam (VALIUM) 10 MG tablet, TAKE 1 TABLET BY MOUTH DAILY AS NEEDED FOR ANXIETY, Disp: 30 tablet, Rfl: 0 .  pantoprazole (PROTONIX) 40 MG tablet, Take 1 tablet by mouth daily., Disp: , Rfl:  .  sucralfate (CARAFATE) 1 g tablet, Take 1 tablet by mouth 2 (two) times daily as needed., Disp: , Rfl:  .  zolpidem (AMBIEN) 10 MG tablet, TAKE 1 TABLET BY MOUTH EVERY NIGHT AT BEDTIME, Disp: 30 tablet, Rfl: 0 .  amphetamine-dextroamphetamine (ADDERALL) 5 MG tablet, Take one tablet daily by mouth as needed (Patient not taking: Reported on 07/19/2016), Disp: 30 tablet, Rfl: 0 .  metoprolol succinate (TOPROL-XL) 25 MG 24 hr tablet, , Disp: , Rfl: 5 .  propranolol (INDERAL) 20 MG tablet, Take 1 tablet (20 mg total) by mouth 3 (three) times daily. (Patient not taking: Reported on 04/15/2016), Disp: 21 tablet, Rfl: 0  Review of Systems  Constitutional: Negative for fever.  Respiratory: Negative for cough.   Cardiovascular: Negative for chest pain.  Gastrointestinal: Positive for abdominal pain (seeing GI for upper abdominal pain; is being treated with Protonix and Carafate) and hematochezia. Negative for anorexia, diarrhea, hematemesis, hemorrhoids, nausea, rectal pain and vomiting.  Genitourinary: Negative for hematuria.  Neurological: Negative for headaches.   Social History  Substance Use Topics  . Smoking status: Never Smoker  . Smokeless tobacco: Never Used  . Alcohol use  No   Objective:   BP 100/64 (BP Location: Right Arm, Patient Position: Sitting, Cuff Size: Normal)   Pulse (!) 104   Temp 97.9 F (36.6 C) (Oral)   Resp 16   Wt 184 lb (83.5 kg)   BMI 26.40 kg/m  Vitals:   07/19/16 1355  BP: 100/64  Pulse: (!) 104  Resp: 16  Temp: 97.9 F (36.6 C)  TempSrc: Oral  Weight: 184 lb (83.5 kg)   Physical Exam  Constitutional: He is  oriented to person, place, and time. He appears well-developed and well-nourished. No distress.  HENT:  Head: Normocephalic and atraumatic.  Right Ear: Hearing normal.  Left Ear: Hearing normal.  Nose: Nose normal.  Eyes: Conjunctivae and lids are normal. Right eye exhibits no discharge. Left eye exhibits no discharge. No scleral icterus.  Pulmonary/Chest: Effort normal. No respiratory distress.  Genitourinary: Rectum normal. Rectal exam shows guaiac negative stool.  Musculoskeletal: Normal range of motion.  Neurological: He is alert and oriented to person, place, and time.  Skin: Skin is intact. No lesion and no rash noted.  Psychiatric: He has a normal mood and affect. His speech is normal and behavior is normal. Thought content normal.      Assessment & Plan:     1. Blood in feces Intermittent bright red blood on tissue over the past week. No constipation, diarrhea or rectal pain. Was seen by gastroenterologist yesterday but did not mention this symptom. Has had similar hematochezia with a diagnosis of a rectal fissure 2-3 years ago. Hemoccult test negative for blood on DRE. Will treat with Anusol suppositories and follow up with Gastroenterologist if needed. - hydrocortisone (ANUSOL-HC) 25 MG suppository; Place 1 suppository (25 mg total) rectally 2 (two) times daily.  Dispense: 12 suppository; Refill: 0 - CBC with Differential/Platelet  2. Sperm donor Worried about an STD since he has decided to be a sperm donor for a friend in [place]. Will get labs for screening. - CBC with Differential/Platelet - HIV antibody - Hepatitis C antibody - RPR - GC/Chlamydia Probe Amp       Dortha Kernennis Latise Dilley, PA  [facility]

## 2016-07-20 LAB — CBC WITH DIFFERENTIAL/PLATELET
Basophils Absolute: 0 10*3/uL (ref 0.0–0.2)
Basos: 0 %
EOS (ABSOLUTE): 0.3 10*3/uL (ref 0.0–0.4)
EOS: 3 %
HEMATOCRIT: 46.4 % (ref 37.5–51.0)
HEMOGLOBIN: 16 g/dL (ref 13.0–17.7)
IMMATURE GRANS (ABS): 0.1 10*3/uL (ref 0.0–0.1)
Immature Granulocytes: 1 %
LYMPHS: 30 %
Lymphocytes Absolute: 2.5 10*3/uL (ref 0.7–3.1)
MCH: 31.3 pg (ref 26.6–33.0)
MCHC: 34.5 g/dL (ref 31.5–35.7)
MCV: 91 fL (ref 79–97)
MONOCYTES: 7 %
Monocytes Absolute: 0.6 10*3/uL (ref 0.1–0.9)
Neutrophils Absolute: 4.8 10*3/uL (ref 1.4–7.0)
Neutrophils: 59 %
PLATELETS: 277 10*3/uL (ref 150–379)
RBC: 5.11 x10E6/uL (ref 4.14–5.80)
RDW: 13.1 % (ref 12.3–15.4)
WBC: 8.2 10*3/uL (ref 3.4–10.8)

## 2016-07-20 LAB — RPR: RPR: NONREACTIVE

## 2016-07-20 LAB — HEPATITIS C ANTIBODY: Hep C Virus Ab: <0.1 s/co ratio (ref 0.0–0.9)

## 2016-07-20 LAB — HIV ANTIBODY (ROUTINE TESTING W REFLEX): HIV Screen 4th Generation wRfx: NONREACTIVE

## 2016-07-21 ENCOUNTER — Telehealth: Payer: Self-pay

## 2016-07-21 LAB — GC/CHLAMYDIA PROBE AMP
Chlamydia trachomatis, NAA: NEGATIVE
Neisseria gonorrhoeae by PCR: NEGATIVE

## 2016-07-21 NOTE — Telephone Encounter (Signed)
Patient advised. A copy placed at front desk for pick up per patient's request.

## 2016-07-21 NOTE — Telephone Encounter (Signed)
-----   Message from Tamsen Roersennis E Chrismon, [place] sent at 07/21/2016  2:12 PM EDT ----- All tests negative for any STD. Should remember he may not be a good candidate for sperm donation to a blood relative. He can get a copy of these test reports as needed.

## 2016-09-09 ENCOUNTER — Other Ambulatory Visit: Payer: Self-pay | Admitting: Family Medicine

## 2016-09-09 DIAGNOSIS — G4700 Insomnia, unspecified: Secondary | ICD-10-CM

## 2016-09-09 DIAGNOSIS — F410 Panic disorder [episodic paroxysmal anxiety] without agoraphobia: Secondary | ICD-10-CM

## 2016-09-09 MED ORDER — ZOLPIDEM TARTRATE 10 MG PO TABS: 10.0000 mg | ORAL_TABLET | Freq: Every day | ORAL | 0 refills | Status: DC

## 2016-09-09 NOTE — Telephone Encounter (Signed)
RX called in at [facility] pharmacy  

## 2016-09-09 NOTE — Telephone Encounter (Signed)
Phone in refill as authorized in medication list.

## 2016-09-09 NOTE — Telephone Encounter (Signed)
Phone in refill of Zolpidem as authorized in medication list. Thanks.

## 2016-09-09 NOTE — Telephone Encounter (Signed)
rx called in-aa 

## 2016-09-12 ENCOUNTER — Ambulatory Visit (INDEPENDENT_AMBULATORY_CARE_PROVIDER_SITE_OTHER): Payer: 59 | Admitting: Family Medicine

## 2016-09-12 ENCOUNTER — Other Ambulatory Visit: Payer: Self-pay

## 2016-09-12 ENCOUNTER — Encounter: Payer: Self-pay | Admitting: Family Medicine

## 2016-09-12 VITALS — BP 106/80 | HR 100 | Temp 98.3°F | Wt 186.0 lb

## 2016-09-12 DIAGNOSIS — K921 Melena: Secondary | ICD-10-CM

## 2016-09-12 DIAGNOSIS — F410 Panic disorder [episodic paroxysmal anxiety] without agoraphobia: Secondary | ICD-10-CM | POA: Diagnosis not present

## 2016-09-12 DIAGNOSIS — K30 Functional dyspepsia: Secondary | ICD-10-CM | POA: Diagnosis not present

## 2016-09-12 NOTE — Progress Notes (Signed)
Patient: Bobby [name] Male    DOB: [birth year]   31 y.o.   MRN: [id number] Visit Date: 09/12/2016  Today's Provider: Dortha Kern, PA   Chief Complaint  Patient presents with  . Follow-up   Subjective:    HPI Patient is here today to request FMLA forms be completed. Patient is requesting intermittent leave for episodes of panic attacks and acid indigestion. Patient states he missed work on 01/08/2016 (9 hrs), 04/22/2016 (2 hrs), 07/29/2016 (2 hrs) and 08/23/2016 (1 hr)  Patient Active Problem List   Diagnosis Date Noted  . WPW (Wolff-Parkinson-White syndrome) 09/09/2014  . Blood in feces 09/02/2014  . Acid indigestion 09/02/2014  . ED (erectile dysfunction) of non-organic origin 09/02/2014  . Cannot sleep 09/02/2014  . Episodic paroxysmal anxiety disorder 09/02/2014  . Fast heart beat 09/02/2014  . Clicking jaw syndrome 09/02/2014  . Bilateral tinnitus 09/02/2014  . Arthralgia of shoulder 09/02/2014  . Ventricular pre-excitation with arrhythmia 09/02/2014  . History of hay fever 08/25/2014  . ADD (attention deficit disorder) 08/25/2014  . Anxiety, generalized 08/25/2014  . Insomnia, persistent 08/25/2014  . Panic disorder without agoraphobia 08/25/2014  . Anxiety 12/26/2013  . Paroxysmal supraventricular tachycardia (HCC) 10/29/2013  . Decreased libido 09/17/2009   Past Surgical History:  Procedure Laterality Date  . CARDIAC CATHETERIZATION    . TONSILLECTOMY  1994  . TONSILLECTOMY     Family History  Problem Relation Age of Onset  . Breast cancer Mother   . Ovarian cancer Mother   . Alcohol abuse Father   . Drug abuse Father   . Heart attack Maternal Grandfather   . Heart attack Paternal Grandfather   . Depression Brother    No Known Allergies  Previous Medications   DIAZEPAM (VALIUM) 10 MG TABLET    TAKE 1 TABLET BY MOUTH DAILY AS NEEDED FOR ANXIETY   METOPROLOL SUCCINATE (TOPROL-XL) 25 MG 24 HR TABLET       PANTOPRAZOLE (PROTONIX) 40 MG TABLET    Take 1  tablet by mouth daily.   PROPRANOLOL (INDERAL) 20 MG TABLET    Take 1 tablet (20 mg total) by mouth 3 (three) times daily.   SUCRALFATE (CARAFATE) 1 G TABLET    Take 1 tablet by mouth 2 (two) times daily as needed.   ZOLPIDEM (AMBIEN) 10 MG TABLET    Take 1 tablet (10 mg total) by mouth at bedtime.    Review of Systems  Constitutional: Negative.   Respiratory: Negative.   Cardiovascular: Negative.   Gastrointestinal: Positive for abdominal pain.       Acid indigestion   Psychiatric/Behavioral: The patient is nervous/anxious.     Social History  Substance Use Topics  . Smoking status: Never Smoker  . Smokeless tobacco: Never Used  . Alcohol use No   Objective:   BP 106/80 (BP Location: Right Arm, Patient Position: Sitting, Cuff Size: Normal)   Pulse 100   Temp 98.3 F (36.8 C) (Oral)   Wt 186 lb (84.4 kg)   SpO2 97%   BMI 26.69 kg/m   Physical Exam  Constitutional: He is oriented to person, place, and time. He appears well-developed and well-nourished. No distress.  HENT:  Head: Normocephalic and atraumatic.  Right Ear: Hearing normal.  Left Ear: Hearing normal.  Nose: Nose normal.  Eyes: Conjunctivae and lids are normal. Right eye exhibits no discharge. Left eye exhibits no discharge. No scleral icterus.  Neck: Neck supple.  Cardiovascular: Normal rate and regular rhythm.   Pulmonary/Chest:  Effort normal and breath sounds normal. No respiratory distress.  Abdominal: Soft. Bowel sounds are normal.  Musculoskeletal: Normal range of motion.  Neurological: He is alert and oriented to person, place, and time.  Skin: Skin is intact. No lesion and no rash noted.  Psychiatric: His speech is normal. Thought content normal. His mood appears anxious. He is hyperactive.      Assessment & Plan:     1. Panic disorder without agoraphobia Presents with FMLA forms for intermittent leave when having panic and anxiety flares that causes palpitations, also. Has flares causing him to  be late to work or out of work a day or two every 6 months. Still taking Diazepam 10 mg qd with Ambien 10 mg hs (both on a prn basis). This seems to control panic better but has to go to the ER or stay home for a day versus being out of work for 1-3 hours when flares occur. Recheck in 3-4 months or recheck if flares occur regularly. May need referral back to a psychiatrist since his has left the area.  2. Acid indigestion Gastroenterologist treat this with Pantoprazole 40 mg qd and/or Carafate 1 gm BID prn. Denies hematemesis.  3. Blood in feces Had some rectal blood on tissue on 07-29-16 and 08-23-16. Treating with Hydrocortisone suppositories when he has had flares. Scheduled for follow up with Dr. Marva PandaSkulskie (gastroenterologist) for endoscopic evaluations in September.

## 2016-09-23 ENCOUNTER — Ambulatory Visit (INDEPENDENT_AMBULATORY_CARE_PROVIDER_SITE_OTHER): Payer: 59 | Admitting: Family Medicine

## 2016-09-23 ENCOUNTER — Encounter: Payer: Self-pay | Admitting: Family Medicine

## 2016-09-23 VITALS — BP 110/72 | HR 75 | Temp 98.3°F | Wt 182.4 lb

## 2016-09-23 DIAGNOSIS — F4001 Agoraphobia with panic disorder: Secondary | ICD-10-CM | POA: Diagnosis not present

## 2016-09-23 MED ORDER — FLUOXETINE HCL 20 MG PO TABS: 20.0000 mg | ORAL_TABLET | Freq: Every day | ORAL | 3 refills | Status: DC

## 2016-09-23 NOTE — Progress Notes (Signed)
Patient: Bobby [name] Male    DOB: 07/15/1985   31 y.o.   MRN: [id number] Visit Date: 09/23/2016  Today's Provider: Dortha Kernennis Bereket Gernert, PA   Chief Complaint  Patient presents with  . Anxiety  . Follow-up   Subjective:    HPI Patient is here today to discuss anxiety/panic attacks that are occurring more frequently. He is requesting that his FMLA forms be updated to 5 days per month instead of 2 days. He is also needing a referral to see a psychiatrist. He was seeing Wallace GoingAlton Williams, but he is no longer at [facility].  Patient Active Problem List   Diagnosis Date Noted  . WPW (Wolff-Parkinson-White syndrome) 09/09/2014  . Blood in feces 09/02/2014  . Acid indigestion 09/02/2014  . ED (erectile dysfunction) of non-organic origin 09/02/2014  . Cannot sleep 09/02/2014  . Episodic paroxysmal anxiety disorder 09/02/2014  . Fast heart beat 09/02/2014  . Clicking jaw syndrome 09/02/2014  . Bilateral tinnitus 09/02/2014  . Arthralgia of shoulder 09/02/2014  . Ventricular pre-excitation with arrhythmia 09/02/2014  . History of hay fever 08/25/2014  . ADD (attention deficit disorder) 08/25/2014  . Anxiety, generalized 08/25/2014  . Insomnia, persistent 08/25/2014  . Panic disorder without agoraphobia 08/25/2014  . Anxiety 12/26/2013  . Paroxysmal supraventricular tachycardia (HCC) 10/29/2013  . Decreased libido 09/17/2009   Past Surgical History:  Procedure Laterality Date  . CARDIAC CATHETERIZATION    . TONSILLECTOMY  1994  . TONSILLECTOMY     Family History  Problem Relation Age of Onset  . Breast cancer Mother   . Ovarian cancer Mother   . Alcohol abuse Father   . Drug abuse Father   . Heart attack Maternal Grandfather   . Heart attack Paternal Grandfather   . Depression Brother    No Known Allergies   Previous Medications   DIAZEPAM (VALIUM) 10 MG TABLET    TAKE 1 TABLET BY MOUTH DAILY AS NEEDED FOR ANXIETY   METOPROLOL SUCCINATE (TOPROL-XL) 25 MG 24 HR TABLET        PANTOPRAZOLE (PROTONIX) 40 MG TABLET    Take 1 tablet by mouth daily.   PROPRANOLOL (INDERAL) 20 MG TABLET    Take 1 tablet (20 mg total) by mouth 3 (three) times daily.   SUCRALFATE (CARAFATE) 1 G TABLET    Take 1 tablet by mouth 2 (two) times daily as needed.   ZOLPIDEM (AMBIEN) 10 MG TABLET    Take 1 tablet (10 mg total) by mouth at bedtime.    Review of Systems  Constitutional: Negative.   Respiratory: Negative.   Cardiovascular: Negative.   Psychiatric/Behavioral: The patient is nervous/anxious.        Panic attacks    Social History  Substance Use Topics  . Smoking status: Never Smoker  . Smokeless tobacco: Never Used  . Alcohol use No   Objective:   BP 110/72 (BP Location: Right Arm, Patient Position: Sitting, Cuff Size: Normal)   Pulse 75   Temp 98.3 F (36.8 C) (Oral)   Wt 182 lb 6.4 oz (82.7 kg)   SpO2 98%   BMI 26.17 kg/m   Physical Exam  Constitutional: He is oriented to person, place, and time. He appears well-developed and well-nourished. No distress.  HENT:  Head: Normocephalic and atraumatic.  Right Ear: Hearing normal.  Left Ear: Hearing normal.  Nose: Nose normal.  Eyes: Conjunctivae and lids are normal. Right eye exhibits no discharge. Left eye exhibits no discharge. No scleral icterus.  Neck: No  thyromegaly present.  Cardiovascular: Normal rate and regular rhythm.   Pulmonary/Chest: Effort normal and breath sounds normal. No respiratory distress.  Abdominal: Soft. Bowel sounds are normal.  Musculoskeletal: Normal range of motion.  Neurological: He is alert and oriented to person, place, and time.  Skin: Skin is intact. No lesion and no rash noted.  Psychiatric: His speech is normal. Thought content normal. His mood appears anxious. He is agitated.   Assessment & Plan:     1. Panic disorder with agoraphobia and severe panic attacks Worsening fear of travel beyond home to work and severe panic attacks that has cause some stomach upset. Valium  some help with severe panic but needs additional baseline help. Will add Prozac and schedule psychiatry referral. Increase FMLA intermittent leave to 4-5 times a month as needed. Recheck progress in a month or, at least, call report. May need higher dosage of Prozac pending initial response. - FLUoxetine (PROZAC) 20 MG tablet; Take 1 tablet (20 mg total) by mouth daily.  Dispense: 30 tablet; Refill: 3 - Ambulatory referral to Psychiatry

## 2016-09-23 NOTE — Patient Instructions (Signed)
Agoraphobia Agoraphobia is a mental health disorder. It is a type of anxiety or fear. People with agoraphobia fear public places where they may be trapped, helpless, or embarrassed in the event of a panic attack or a loss of control. They often start to avoid the feared situations or insist that another person go with them. Agoraphobia may interfere with normal daily activities and personal relationships. People with severe agoraphobia may become completely homebound and dependent on others for grocery shopping and other errands. Agoraphobia usually begins before age 35, but it can start in the older adult years. People with agoraphobia are at risk for other anxiety disorders, depression, and substance abuse. What are the causes? It is not known exactly what causes agoraphobia. What increases the risk? Agoraphobia is more common in women. People who have panic disorder or have family members with agoraphobia are at higher risk of developing agoraphobia. What are the signs or symptoms? You may have agoraphobia if you have the following symptoms for 6 months or longer:  Intense fear about two or more of the following: ? Using public transportation, such as cars, buses, planes, trains, or ships. ? Being in open spaces, such as parking lots, shopping malls, or bridges. ? Being in enclosed spaces, such as shops, theaters, or elevators. ? Standing in line or being in a crowd. ? Being outside the home alone.  Fear that is due to thoughts of being unable to escape or get help if certain events occur. The feared event may be a panic attack or panic-like symptoms, such as a racing heart, dizziness, and trouble breathing. In older people, the feared event may be a fall or loss of bowel control.  Reacting to feared situations by: ? Avoiding them. ? Requiring the presence of a companion. ? Enduring them with intense fear or anxiety.  Fear or anxiety that is out of proportion to the actual danger that is  posed by the event and the situation.  How is this diagnosed? Agoraphobia may be diagnosed by your health care provider. You will be asked questions about your fears and how they have affected you. You may be asked about your medical history and your use of medicines, alcohol, or drugs. Your health care provider may do a physical exam and order lab tests or other studies to rule out a medical condition. You may also be referred to a mental health specialist. How is this treated? Treatment usually includes a combination of counseling and medicines.  Counseling or talk therapy. Talk therapy is provided by mental health specialists. The following forms of talk therapy can be especially helpful: ? Cognitive therapy. Cognitive therapy helps you to recognize and change unrealistic thoughts and beliefs that contribute to your fears. ? Exposure therapy. Exposure therapy helps you to face and overcome your fears in a relaxed state and a safe environment.  Medicines. The following types of medicines may be helpful: ? Antidepressants. Antidepressants are believed to affect certain chemicals in your brain. They can decrease general levels of anxiety and can help to prevent panic attacks. ? Benzodiazepines. These medicines block feelings of anxiety and panic. They are very effective and act more quickly than antidepressants, but they are highly addictive. These medicines are recommended only for short-term use. ? Beta blockers. Beta blockers can reduce physical symptoms of anxiety, such as a racing heart, sweating, and tremor. They may help you to feel less tense and anxious.  Follow these instructions at home:  Keep all follow-up visits as   directed by your health care provider. This is important.  Take all medicines only as directed by your health care provider.  Try to exercise, eat a healthy diet, and get plenty of sleep.  Do not drink alcohol.  Do not use illegal drugs. Where to find more  information: For more information, visit the website of the Anxiety and Depression Association of America (ADAA): www.adaa.org Contact a health care provider if:  Your fear or anxiety gets worse.  You have new fears or anxieties. Get help right away if:  You have serious thoughts about hurting yourself or someone else.  You have trouble breathing or have chest pain. This information is not intended to replace advice given to you by your health care provider. Make sure you discuss any questions you have with your health care provider. Document Released: 07/14/2010 Document Revised: 07/30/2015 Document Reviewed: 06/24/2013 Elsevier Interactive Patient Education  2018 Elsevier Inc.  

## 2016-10-21 ENCOUNTER — Other Ambulatory Visit: Payer: Self-pay | Admitting: Family Medicine

## 2016-10-21 DIAGNOSIS — G4700 Insomnia, unspecified: Secondary | ICD-10-CM

## 2016-10-21 DIAGNOSIS — F410 Panic disorder [episodic paroxysmal anxiety] without agoraphobia: Secondary | ICD-10-CM

## 2016-10-21 NOTE — Telephone Encounter (Signed)
rxs called in-aa 

## 2016-10-21 NOTE — Telephone Encounter (Signed)
Please call in zolpidem and diazepam

## 2016-10-21 NOTE — Telephone Encounter (Signed)
Please review for dennis

## 2016-12-01 ENCOUNTER — Encounter: Admission: RE | Payer: Self-pay | Source: Ambulatory Visit

## 2016-12-01 ENCOUNTER — Ambulatory Visit: Admission: RE | Admit: 2016-12-01 | Payer: 59 | Source: Ambulatory Visit | Admitting: Gastroenterology

## 2016-12-01 SURGERY — ESOPHAGOGASTRODUODENOSCOPY (EGD) WITH PROPOFOL
Anesthesia: General

## 2017-01-07 IMAGING — CT CT HEAD W/O CM
5 of 7 series · 16 of 47 positions shown, 17 images · non-contrast
Comparison: radiograph of the cervical spine 04/16/2012

CLINICAL DATA: Found unresponsive.  Complains of headache.

EXAM:
CT HEAD WITHOUT CONTRAST
CT CERVICAL SPINE WITHOUT CONTRAST
TECHNIQUE: Multidetector CT imaging of the head and cervical spine was
performed following the standard protocol without intravenous
contrast. Multiplanar CT image reconstructions of the cervical spine
were also generated.

[Series 2: head wo · axial · 0.43mm/px · z∈[-65,-15]mm · 2 of 30 slices shown, 3 images]
[im 10/30  brain]
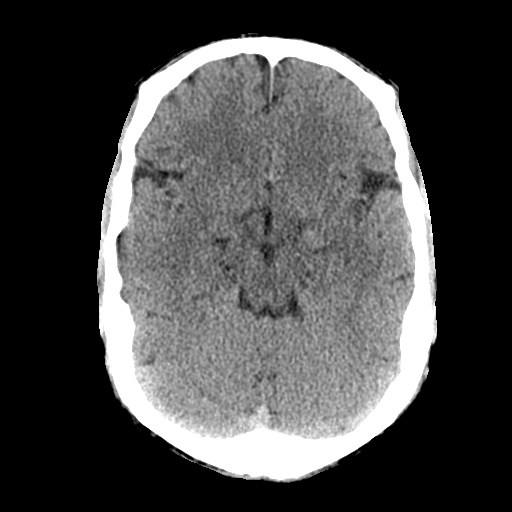
[im 10/30  bone]
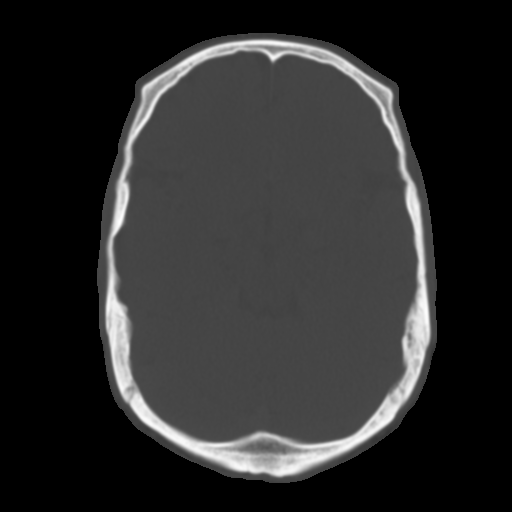
[im 20/30  brain]
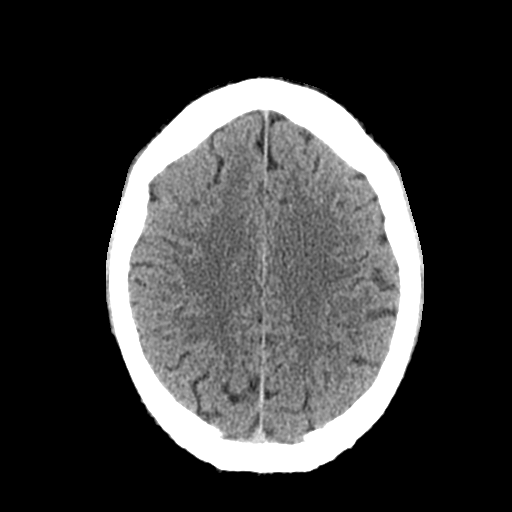

[Series 4: coronal soft tissue · coronal · 0.29mm/px · 2 of 66 slices shown]
[im 5/66  brain]
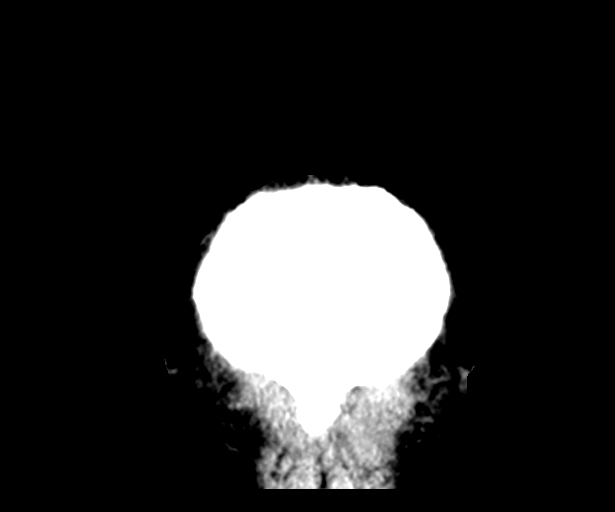
[im 9/66  brain]
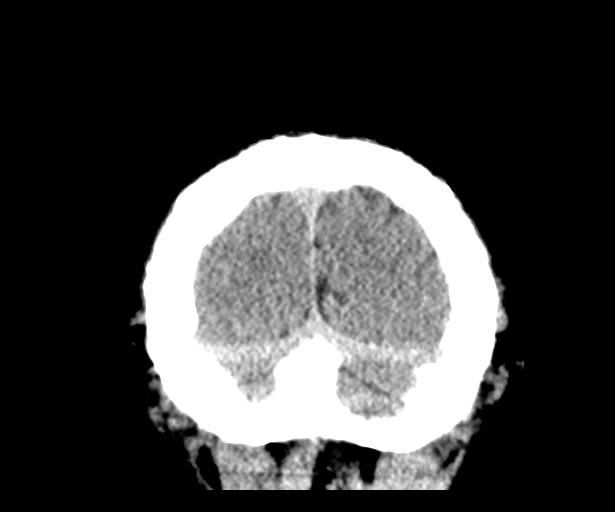

[Series 5: sagittal soft tissue · sagittal · 0.29mm/px · 2 of 54 slices shown]
[im 18/54  brain]
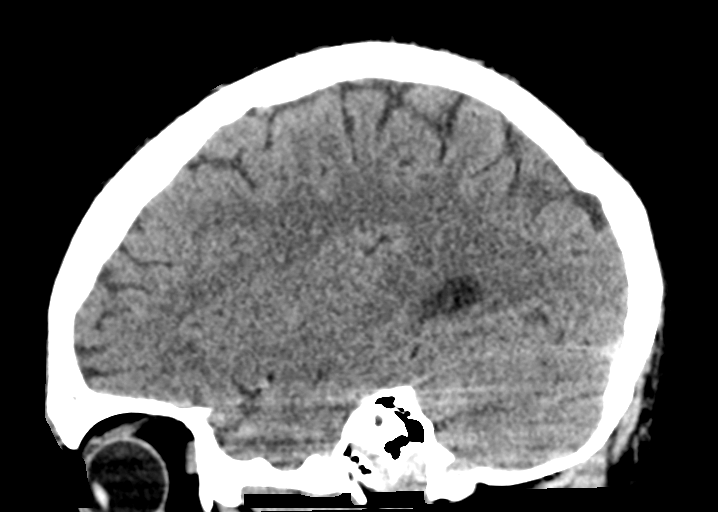
[im 36/54  brain]
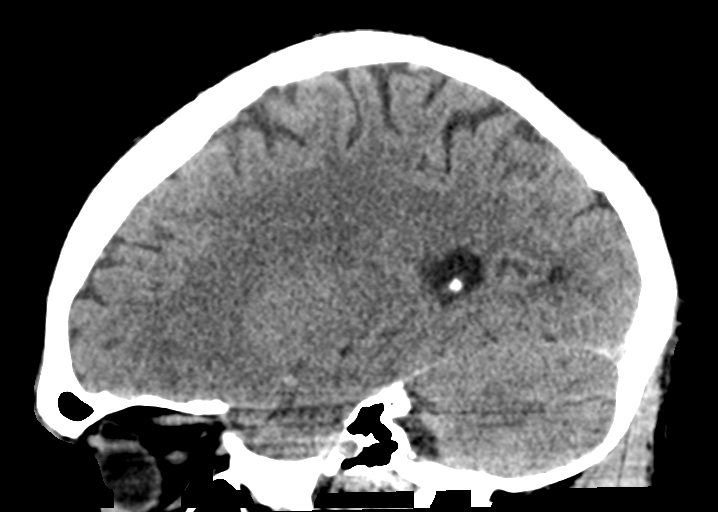

[Series 7: c spine soft · axial · 0.32mm/px · z∈[-273,-239]mm · 2 of 101 slices shown]
[im 9/101  brain]
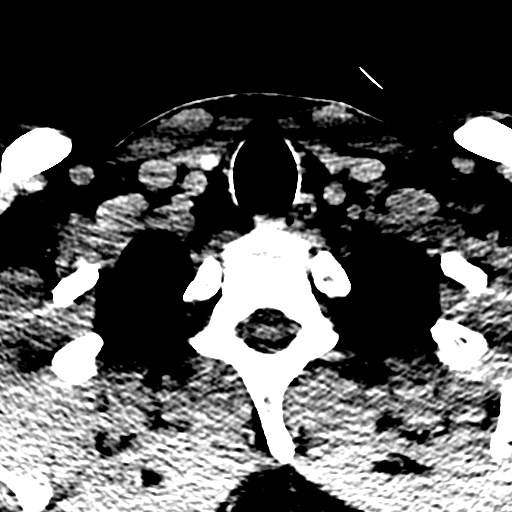
[im 26/101  brain]
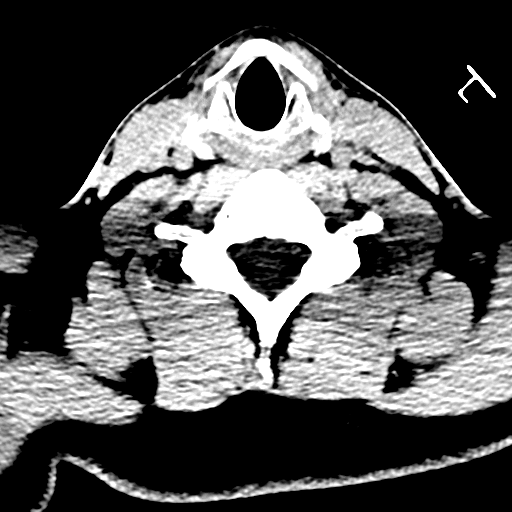

[Series 10: orthogonal bone · axial · 0.23mm/px · z∈[-285,-128]mm · 8 of 101 slices shown]
[im 9/101  bone]
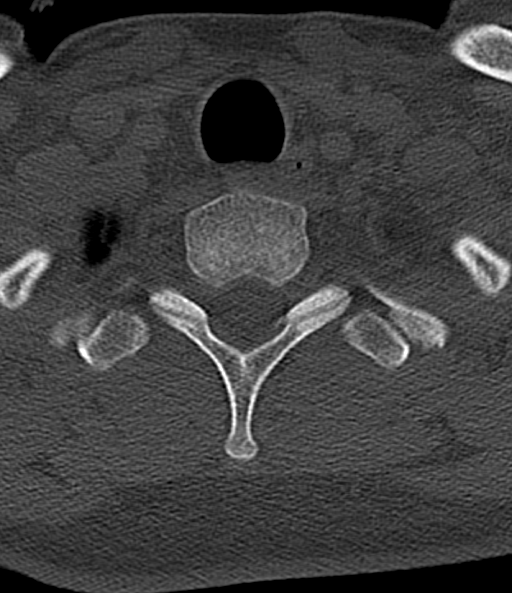
[im 26/101  bone]
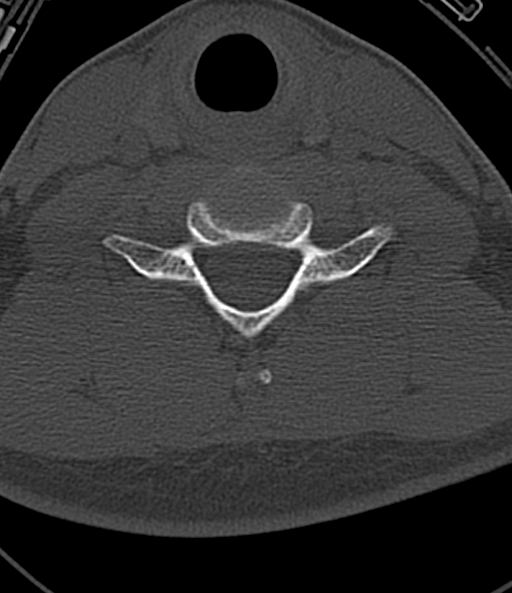
[im 34/101  bone]
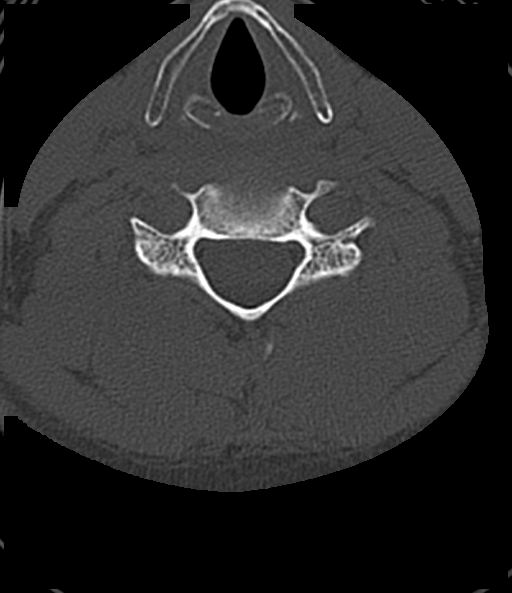
[im 42/101  bone]
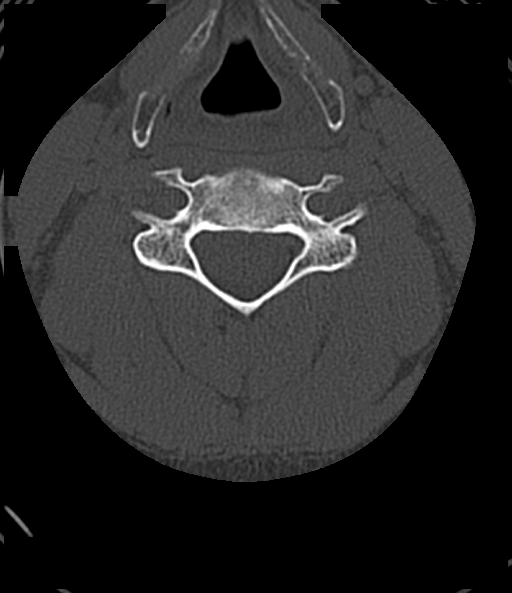
[im 59/101  bone]
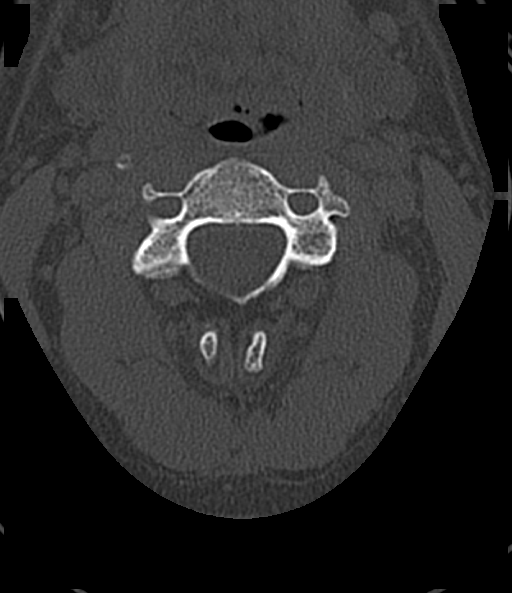
[im 67/101  bone]
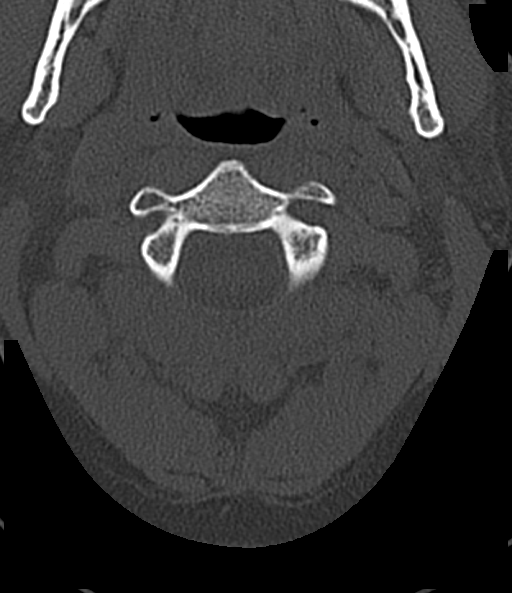
[im 76/101  bone]
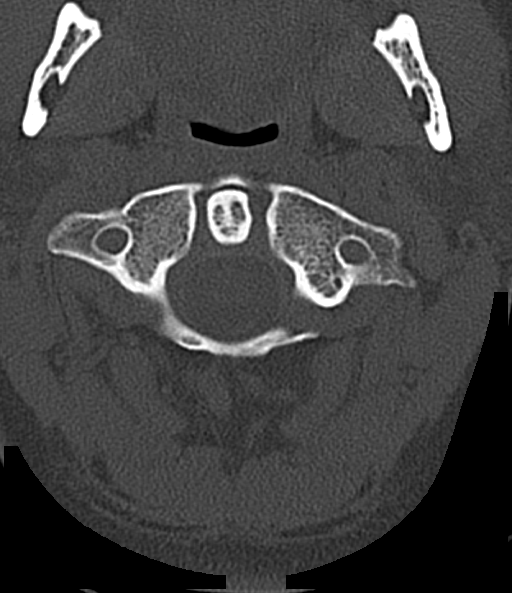
[im 92/101  bone]
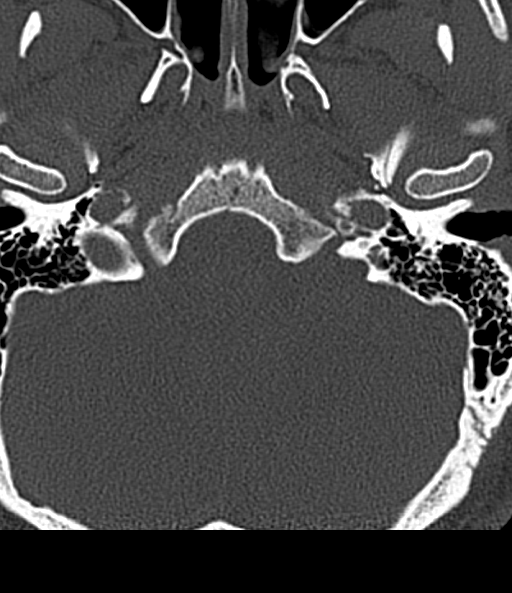

[16 of 47 positions shown; findings below may reference images not displayed]

FINDINGS: CT HEAD FINDINGS

Brain: No evidence of acute infarction, hemorrhage, hydrocephalus,
extra-axial collection or mass lesion/mass effect.

Vascular: No hyperdense vessel or unexpected calcification.

Skull: Normal. Negative for fracture or focal lesion.

Sinuses/Orbits: Minimal polypoid mucosal thickening of the ethmoid
air cells, otherwise no acute finding.

Other: None.

CT CERVICAL SPINE FINDINGS

Alignment: Normal.

Skull base and vertebrae: No acute fracture. No primary bone lesion
or focal pathologic process.

Soft tissues and spinal canal: No prevertebral fluid or swelling. No
visible canal hematoma.

Disc levels:  Normal.

Upper chest: Negative.

Other: None.
IMPRESSION: No acute intracranial abnormality.

No evidence of acute traumatic injury to cervical spine.

## 2017-01-11 ENCOUNTER — Telehealth: Payer: Self-pay

## 2017-01-11 NOTE — Telephone Encounter (Signed)
Patient is requesting a note for work due to increased panic attacks since his grandmother passed away in October. Patient states he has been having a hard time dealing with this since she raised him. He states he had to leave work 4-5 days early and has missed 2 days since October due to increased panic attacks and anxiety issues. CB#[phone]  Fax # [phone] Attn: Fabian NovemberJennifer Giangrande/Allen Farler

## 2017-01-12 NOTE — Telephone Encounter (Signed)
Need specific dates he was out and he may need to check with his employer about and FMLA forms that need to be completed.

## 2017-01-12 NOTE — Telephone Encounter (Signed)
LMTCB

## 2017-01-23 NOTE — Telephone Encounter (Signed)
Left message to call back  

## 2017-03-03 ENCOUNTER — Encounter: Payer: Self-pay | Admitting: Family Medicine

## 2017-03-03 ENCOUNTER — Ambulatory Visit: Payer: 59 | Admitting: Family Medicine

## 2017-03-03 VITALS — BP 102/70 | HR 84 | Temp 98.5°F | Wt 177.2 lb

## 2017-03-03 DIAGNOSIS — F410 Panic disorder [episodic paroxysmal anxiety] without agoraphobia: Secondary | ICD-10-CM | POA: Diagnosis not present

## 2017-03-03 DIAGNOSIS — G4700 Insomnia, unspecified: Secondary | ICD-10-CM

## 2017-03-03 DIAGNOSIS — K219 Gastro-esophageal reflux disease without esophagitis: Secondary | ICD-10-CM

## 2017-03-03 MED ORDER — ZOLPIDEM TARTRATE 10 MG PO TABS: 10.0000 mg | ORAL_TABLET | Freq: Every day | ORAL | 1 refills | Status: DC

## 2017-03-03 MED ORDER — DIAZEPAM 10 MG PO TABS: 10.0000 mg | ORAL_TABLET | Freq: Every day | ORAL | 1 refills | Status: DC | PRN

## 2017-03-03 MED ORDER — PANTOPRAZOLE SODIUM 40 MG PO TBEC: 40.0000 mg | DELAYED_RELEASE_TABLET | Freq: Every day | ORAL | 3 refills | Status: DC

## 2017-03-03 NOTE — Progress Notes (Signed)
Patient: Bobby [name] Male    DOB: 10/31/1985   31 y.o.   MRN: [id number] Visit Date: 03/03/2017  Today's Provider: Dortha Kernennis Vianna Venezia, PA   Chief Complaint  Patient presents with  . panic disorder follow up   Subjective:    HPI  Panic disorder/anxiety follow up:  Patient presents to for a follow up. Last OV was on 09/23/16. At that visit Prozac 20 mg was added and  psychiatry referral was placed. Patient has not been scheduled. The referral was placed at a facility and the notes states patient was denied due to be dismissed from practice previously. Patient states he stopped taking Prozac after about 2 weeks due to side effects. He reports side effects included weird dreams and he felt "blah". He reports symptoms are way worse now. His grandmother passed away 12/14/16 which made anxiety symptoms worsen. Patient is also requesting FMLA forms be updated.    Past Medical History:  Diagnosis Date  . ADHD (attention deficit hyperactivity disorder)   . Anxiety   . SVT (supraventricular tachycardia) (HCC)    resolved since ablation  . WPW (Wolff-Parkinson-White syndrome)    resolved since ablation   Past Surgical History:  Procedure Laterality Date  . CARDIAC CATHETERIZATION    . TONSILLECTOMY  1994  . TONSILLECTOMY     Family History  Problem Relation Age of Onset  . Breast cancer Mother   . Ovarian cancer Mother   . Alcohol abuse Father   . Drug abuse Father   . Heart attack Maternal Grandfather   . Heart attack Paternal Grandfather   . Depression Brother    No Known Allergies  Current Outpatient Medications:  .  diazepam (VALIUM) 10 MG tablet, TAKE 1 TABLET BY MOUTH EVERY DAY AS NEEDED, Disp: 30 tablet, Rfl: 1 .  pantoprazole (PROTONIX) 40 MG tablet, Take 1 tablet by mouth daily., Disp: , Rfl:  .  zolpidem (AMBIEN) 10 MG tablet, TAKE 1 TABLET BY MOUTH EVERY NIGHT AT BEDTIME, Disp: 30 tablet, Rfl: 1 .  FLUoxetine (PROZAC) 20 MG tablet, Take 1 tablet (20 mg total) by  mouth daily. (Patient not taking: Reported on 03/03/2017), Disp: 30 tablet, Rfl: 3 .  propranolol (INDERAL) 20 MG tablet, Take 1 tablet (20 mg total) by mouth 3 (three) times daily. (Patient not taking: Reported on 03/03/2017), Disp: 21 tablet, Rfl: 0 .  sucralfate (CARAFATE) 1 g tablet, Take 1 tablet by mouth 2 (two) times daily as needed., Disp: , Rfl:   Review of Systems  Social History   Tobacco Use  . Smoking status: Never Smoker  . Smokeless tobacco: Never Used  Substance Use Topics  . Alcohol use: No    Alcohol/week: 0.0 oz   Objective:   BP 102/70 (BP Location: Right Arm, Patient Position: Sitting, Cuff Size: Normal)   Pulse 84   Temp 98.5 F (36.9 C) (Oral)   Wt 177 lb 3.2 oz (80.4 kg)   SpO2 98%   BMI 25.43 kg/m   Physical Exam  Constitutional: He is oriented to person, place, and time. He appears well-developed and well-nourished. No distress.  HENT:  Head: Normocephalic and atraumatic.  Right Ear: Hearing normal.  Left Ear: Hearing normal.  Nose: Nose normal.  Eyes: Conjunctivae and lids are normal. Right eye exhibits no discharge. Left eye exhibits no discharge. No scleral icterus.  Neck: Neck supple.  Cardiovascular: Normal rate and regular rhythm.  Pulmonary/Chest: Effort normal. No respiratory distress.  Abdominal: Soft. Bowel sounds  are normal. He exhibits no mass. There is no tenderness. There is no guarding.  Musculoskeletal: Normal range of motion.  Neurological: He is alert and oriented to person, place, and time.  Skin: Skin is intact. No lesion and no rash noted.  Psychiatric: His speech is normal. Thought content normal. His mood appears anxious. He is agitated and withdrawn. He exhibits a depressed mood. He expresses no suicidal plans and no homicidal plans.      Assessment & Plan:     1. Panic disorder without agoraphobia Continues to have agoraphobia in any crowds and panic attacks more prevalent since grandmother died in October 2018. Using  Diazepam 2-3 times a week and has been out of work one day a week since her death due to anxiety and panic attacks. Tried the Prozac for 2 weeks but stopped it due to bizarre dreams. Had been dismissed from his last psychiatrist due to aggressive behavior toward one of their staff. States he is ready to try again and feels more options will be available since he will be changing insurance in January. Refilled Diazepam and recommend recheck in 3 months. Will complete FMLA forms for intermittent leave due to acute panic and agoraphobia attacks. - diazepam (VALIUM) 10 MG tablet; Take 1 tablet (10 mg total) by mouth daily as needed.  Dispense: 30 tablet; Refill: 1 - Ambulatory referral to Psychiatry  2. Insomnia, persistent Had to restart Ambien the past week due to awaking with panic attacks. Unable to sleep with Melatonin trial. Refill Ambien and recommend attempt to schedule psychiatry referral again. - zolpidem (AMBIEN) 10 MG tablet; Take 1 tablet (10 mg total) by mouth at bedtime.  Dispense: 30 tablet; Refill: 1 - Ambulatory referral to Psychiatry  3. Gastroesophageal reflux disease, esophagitis presence not specified Has been followed by Dr. Ricki RodriguezSkulski (gastroenterologist) but has no had upper EGD yet. States he has done H.pylori stool test. Continues to have dyspepsia but no melena or hematemesis if he does not take the Protonix every day. Reflux acid to back of mouth at night. Recommend he continue Protonix and schedule follow up with [facility]. - pantoprazole (PROTONIX) 40 MG tablet; Take 1 tablet (40 mg total) by mouth daily.  Dispense: 30 tablet; Refill: 3        Dortha Kernennis Kenzee Bassin, PA  [facility]

## 2017-03-13 ENCOUNTER — Encounter: Payer: Self-pay | Admitting: Family Medicine

## 2017-03-20 ENCOUNTER — Encounter: Payer: Self-pay | Admitting: Family Medicine

## 2017-03-20 NOTE — Telephone Encounter (Signed)
Please fax [facility] to the [facility] for this patient.

## 2017-04-17 DIAGNOSIS — F411 Generalized anxiety disorder: Secondary | ICD-10-CM | POA: Diagnosis not present

## 2017-04-27 DIAGNOSIS — F411 Generalized anxiety disorder: Secondary | ICD-10-CM | POA: Diagnosis not present

## 2017-05-01 ENCOUNTER — Encounter: Payer: Self-pay | Admitting: Family Medicine

## 2017-05-04 ENCOUNTER — Encounter: Payer: Self-pay | Admitting: Family Medicine

## 2017-05-04 ENCOUNTER — Ambulatory Visit: Payer: BLUE CROSS/BLUE SHIELD | Admitting: Family Medicine

## 2017-05-04 VITALS — BP 102/72 | HR 93 | Temp 98.4°F | Wt 180.2 lb

## 2017-05-04 DIAGNOSIS — G4700 Insomnia, unspecified: Secondary | ICD-10-CM | POA: Diagnosis not present

## 2017-05-04 DIAGNOSIS — F411 Generalized anxiety disorder: Secondary | ICD-10-CM | POA: Diagnosis not present

## 2017-05-04 DIAGNOSIS — F410 Panic disorder [episodic paroxysmal anxiety] without agoraphobia: Secondary | ICD-10-CM

## 2017-05-04 NOTE — Progress Notes (Signed)
Patient: Bobby [name] Male    DOB: [birth year]   32 y.o.   MRN: [id number] Visit Date: 05/04/2017  Today's Provider: Dortha Kern, PA   Chief Complaint  Patient presents with  . FMLA forms   Subjective:    HPI Patient presents today to request FMLA forms be completed to include 5 days per month. Patient states had family emergencies the past 2 weeks and missed work.  He reports he has been feeling  ill, and having anxiety/panic attack flares.Patient has a list of the times he missed from work.  He states he had visits with his new Therapist the past 2 weeks to help with cope with family issues and flares.   Patient states he thought his FMLA was 5 days a month, but was informed by [name] [facility] that he was only approved for 2 days per month.   Past Medical History:  Diagnosis Date  . ADHD (attention deficit hyperactivity disorder)   . Anxiety   . SVT (supraventricular tachycardia) (HCC)    resolved since ablation  . WPW (Wolff-Parkinson-White syndrome)    resolved since ablation   Patient Active Problem List   Diagnosis Date Noted  . GERD (gastroesophageal reflux disease) 03/03/2017  . WPW (Wolff-Parkinson-White syndrome) 09/09/2014  . Blood in feces 09/02/2014  . Acid indigestion 09/02/2014  . ED (erectile dysfunction) of non-organic origin 09/02/2014  . Cannot sleep 09/02/2014  . Episodic paroxysmal anxiety disorder 09/02/2014  . Fast heart beat 09/02/2014  . Clicking jaw syndrome 09/02/2014  . Bilateral tinnitus 09/02/2014  . Arthralgia of shoulder 09/02/2014  . Ventricular pre-excitation with arrhythmia 09/02/2014  . History of hay fever 08/25/2014  . ADD (attention deficit disorder) 08/25/2014  . Anxiety, generalized 08/25/2014  . Insomnia, persistent 08/25/2014  . Panic disorder without agoraphobia 08/25/2014  . Anxiety 12/26/2013  . Paroxysmal supraventricular tachycardia (HCC) 10/29/2013  . Decreased libido 09/17/2009   Past Surgical History:    Procedure Laterality Date  . CARDIAC CATHETERIZATION    . TONSILLECTOMY  1994  . TONSILLECTOMY     Family History  Problem Relation Age of Onset  . Breast cancer Mother   . Ovarian cancer Mother   . Alcohol abuse Father   . Drug abuse Father   . Heart attack Maternal Grandfather   . Heart attack Paternal Grandfather   . Depression Brother    No Known Allergies  Current Outpatient Medications:  .  diazepam (VALIUM) 10 MG tablet, Take 1 tablet (10 mg total) by mouth daily as needed., Disp: 30 tablet, Rfl: 1 .  pantoprazole (PROTONIX) 40 MG tablet, Take 1 tablet (40 mg total) by mouth daily., Disp: 30 tablet, Rfl: 3 .  sucralfate (CARAFATE) 1 g tablet, Take 1 tablet by mouth 2 (two) times daily as needed., Disp: , Rfl:  .  zolpidem (AMBIEN) 10 MG tablet, Take 1 tablet (10 mg total) by mouth at bedtime., Disp: 30 tablet, Rfl: 1  Review of Systems  Constitutional: Negative.   Respiratory: Negative.   Cardiovascular: Negative.   Psychiatric/Behavioral: The patient is nervous/anxious.     Social History   Tobacco Use  . Smoking status: Never Smoker  . Smokeless tobacco: Never Used  Substance Use Topics  . Alcohol use: No    Alcohol/week: 0.0 oz   Objective:   BP 102/72 (BP Location: Left Arm, Patient Position: Sitting, Cuff Size: Normal)   Pulse 93   Temp 98.4 F (36.9 C) (Oral)   Wt 180  lb 3.2 oz (81.7 kg)   SpO2 98%   BMI 25.86 kg/m    Physical Exam  Constitutional: He is oriented to person, place, and time. He appears well-developed and well-nourished. No distress.  HENT:  Head: Normocephalic and atraumatic.  Right Ear: Hearing normal.  Left Ear: Hearing normal.  Nose: Nose normal.  Eyes: Conjunctivae and lids are normal. Right eye exhibits no discharge. Left eye exhibits no discharge. No scleral icterus.  Neck: Neck supple.  Cardiovascular: Normal rate and regular rhythm.  Pulmonary/Chest: Effort normal and breath sounds normal. No respiratory distress.   Abdominal: Soft. Bowel sounds are normal.  Musculoskeletal: Normal range of motion.  Neurological: He is alert and oriented to person, place, and time.  Skin: Skin is intact. No lesion and no rash noted.  Psychiatric: His speech is normal. Thought content normal. His mood appears anxious. He is slowed. He expresses no suicidal plans and no homicidal plans.      Assessment & Plan:     1. Panic disorder without agoraphobia Attacks occurring up to 5 times a month on average. Uses Diazepam 10 mg once on the days of attacks and feels it helps control symptoms well. Presently seeing his psychologist once a week [name] and feels this counseling is helping to control symptoms better. No longer taking Prozac. Stressors are from his job and being the primary caretaker of an aunt who has severe COPD and is illiterate (can't read or write). Completed FMLA for for intermittent leave due to his chronic disease and caring for a family member. Recommend he schedule for CPE/follow up in 2-3 months.  2. Insomnia, persistent Associated with panic and agoraphobia. Occasionally uses Ambien to get to sleep.        Dortha Kernennis Iszabella Hebenstreit, PA  [facility]

## 2017-05-11 DIAGNOSIS — F411 Generalized anxiety disorder: Secondary | ICD-10-CM | POA: Diagnosis not present

## 2017-05-15 ENCOUNTER — Encounter: Payer: Self-pay | Admitting: Family Medicine

## 2017-05-15 NOTE — Telephone Encounter (Signed)
Filled out forms on 05-04-17 and should be here somewhere either in the chart or waiting to be faxed. Have a letter from [facility] with approval for family medical leave approval through July 2019 in the media file in his chart dated 05-01-17.

## 2017-05-15 NOTE — Progress Notes (Unsigned)
Patient called back.  [facility] has not received any papers from us per the patient. Forms need to be refaxed to them.

## 2017-05-16 ENCOUNTER — Encounter: Payer: Self-pay | Admitting: Family Medicine

## 2017-05-18 ENCOUNTER — Other Ambulatory Visit: Payer: Self-pay | Admitting: Family Medicine

## 2017-05-18 DIAGNOSIS — K219 Gastro-esophageal reflux disease without esophagitis: Secondary | ICD-10-CM | POA: Diagnosis not present

## 2017-05-18 DIAGNOSIS — F410 Panic disorder [episodic paroxysmal anxiety] without agoraphobia: Secondary | ICD-10-CM

## 2017-05-18 DIAGNOSIS — K921 Melena: Secondary | ICD-10-CM | POA: Diagnosis not present

## 2017-05-22 DIAGNOSIS — F411 Generalized anxiety disorder: Secondary | ICD-10-CM | POA: Diagnosis not present

## 2017-05-26 ENCOUNTER — Ambulatory Visit: Payer: BLUE CROSS/BLUE SHIELD | Admitting: Family Medicine

## 2017-05-26 ENCOUNTER — Encounter: Payer: Self-pay | Admitting: Family Medicine

## 2017-05-26 VITALS — BP 104/60 | HR 82 | Temp 98.2°F | Resp 16 | Wt 179.0 lb

## 2017-05-26 DIAGNOSIS — F410 Panic disorder [episodic paroxysmal anxiety] without agoraphobia: Secondary | ICD-10-CM | POA: Diagnosis not present

## 2017-05-26 NOTE — Progress Notes (Signed)
Patient: Bobby [name] Male    DOB: 10/09/1985   32 y.o.   MRN: [id number] Visit Date: 05/26/2017  Today's Provider: Dortha Kernennis Treavon Castilleja, PA   No chief complaint on file.  Subjective:    HPI  Patient is here to get his FMLA paperwork completed.    Patient Active Problem List   Diagnosis Date Noted  . GERD (gastroesophageal reflux disease) 03/03/2017  . WPW (Wolff-Parkinson-White syndrome) 09/09/2014  . Blood in feces 09/02/2014  . Acid indigestion 09/02/2014  . ED (erectile dysfunction) of non-organic origin 09/02/2014  . Cannot sleep 09/02/2014  . Episodic paroxysmal anxiety disorder 09/02/2014  . Fast heart beat 09/02/2014  . Clicking jaw syndrome 09/02/2014  . Bilateral tinnitus 09/02/2014  . Arthralgia of shoulder 09/02/2014  . Ventricular pre-excitation with arrhythmia 09/02/2014  . History of hay fever 08/25/2014  . ADD (attention deficit disorder) 08/25/2014  . Anxiety, generalized 08/25/2014  . Insomnia, persistent 08/25/2014  . Panic disorder without agoraphobia 08/25/2014  . Anxiety 12/26/2013  . Paroxysmal supraventricular tachycardia (HCC) 10/29/2013  . Decreased libido 09/17/2009   Past Medical History:  Diagnosis Date  . ADHD (attention deficit hyperactivity disorder)   . Anxiety   . SVT (supraventricular tachycardia) (HCC)    resolved since ablation  . WPW (Wolff-Parkinson-White syndrome)    resolved since ablation   Past Surgical History:  Procedure Laterality Date  . CARDIAC CATHETERIZATION    . TONSILLECTOMY  1994  . TONSILLECTOMY     Family History  Problem Relation Age of Onset  . Breast cancer Mother   . Ovarian cancer Mother   . Alcohol abuse Father   . Drug abuse Father   . Heart attack Maternal Grandfather   . Heart attack Paternal Grandfather   . Depression Brother    No Known Allergies  Current Outpatient Medications:  .  diazepam (VALIUM) 10 MG tablet, TAKE 1 TABLET BY MOUTH EVERY DAY AS NEEDED, Disp: 30 tablet, Rfl: 0 .   pantoprazole (PROTONIX) 40 MG tablet, Take 1 tablet (40 mg total) by mouth daily., Disp: 30 tablet, Rfl: 3 .  sucralfate (CARAFATE) 1 g tablet, Take 1 tablet by mouth 2 (two) times daily as needed., Disp: , Rfl:  .  zolpidem (AMBIEN) 10 MG tablet, Take 1 tablet (10 mg total) by mouth at bedtime., Disp: 30 tablet, Rfl: 1  Review of Systems  Constitutional: Negative for appetite change, chills and fever.  Respiratory: Negative for chest tightness, shortness of breath and wheezing.   Cardiovascular: Negative for chest pain and palpitations.  Gastrointestinal: Negative for abdominal pain, nausea and vomiting.   Social History   Tobacco Use  . Smoking status: Never Smoker  . Smokeless tobacco: Never Used  Substance Use Topics  . Alcohol use: No    Alcohol/week: 0.0 oz   Objective:   BP 104/60 (BP Location: Right Arm, Patient Position: Sitting, Cuff Size: Large)   Pulse 82   Temp 98.2 F (36.8 C) (Oral)   Resp 16   Wt 179 lb (81.2 kg)   SpO2 98%   BMI 25.68 kg/m  Vitals:   05/26/17 1446  BP: 104/60  Pulse: 82  Resp: 16  Temp: 98.2 F (36.8 C)  TempSrc: Oral  SpO2: 98%  Weight: 179 lb (81.2 kg)   Physical Exam  Constitutional: He is oriented to person, place, and time. He appears well-developed and well-nourished. No distress.  HENT:  Head: Normocephalic and atraumatic.  Right Ear: Hearing normal.  Left Ear: Hearing normal.  Nose: Nose normal.  Eyes: Conjunctivae and lids are normal. Right eye exhibits no discharge. Left eye exhibits no discharge. No scleral icterus.  Pulmonary/Chest: Effort normal. No respiratory distress.  Musculoskeletal: Normal range of motion.  Neurological: He is alert and oriented to person, place, and time.  Skin: Skin is intact. No lesion and no rash noted.  Psychiatric: His speech is normal. Thought content normal. His mood appears anxious. He is withdrawn.      Assessment & Plan:     1. Panic disorder without agoraphobia Fair control  with use of Valium when attacks occur. When anxiety flares, it causes IBS with hemorrhoid irritation to occur. Continue Ambien to help with sleep pattern prn and recheck prn or in 2-3 months.  2. Episodic paroxysmal anxiety disorder Severe flares of anxiety leads to panic attacks that causes difficulty working in public or being in a crowd. Medication can cause drowsiness and should not drive when taken for attacks. Completed form for intermittent leave/FMLA. May not be allowed to claim family leave because he is helping his aunt get to her medical appointments (not a spouse, parent or child).       Dortha Kern, PA  [facility]

## 2017-06-03 DIAGNOSIS — F411 Generalized anxiety disorder: Secondary | ICD-10-CM | POA: Diagnosis not present

## 2017-06-27 ENCOUNTER — Other Ambulatory Visit: Payer: Self-pay | Admitting: Family Medicine

## 2017-06-27 DIAGNOSIS — G4700 Insomnia, unspecified: Secondary | ICD-10-CM

## 2017-06-27 DIAGNOSIS — F410 Panic disorder [episodic paroxysmal anxiety] without agoraphobia: Secondary | ICD-10-CM

## 2017-08-08 ENCOUNTER — Encounter: Payer: Self-pay | Admitting: Family Medicine

## 2017-08-08 ENCOUNTER — Other Ambulatory Visit: Payer: Self-pay | Admitting: Family Medicine

## 2017-08-08 DIAGNOSIS — F4001 Agoraphobia with panic disorder: Secondary | ICD-10-CM

## 2017-08-08 DIAGNOSIS — F410 Panic disorder [episodic paroxysmal anxiety] without agoraphobia: Secondary | ICD-10-CM

## 2017-08-10 ENCOUNTER — Other Ambulatory Visit: Payer: Self-pay | Admitting: Family Medicine

## 2017-08-10 DIAGNOSIS — F4001 Agoraphobia with panic disorder: Secondary | ICD-10-CM

## 2017-08-10 NOTE — Telephone Encounter (Signed)
Printed prescription. Will you fax it to the pharmacy?

## 2017-08-10 NOTE — Telephone Encounter (Signed)
RX faxed to [facility] pharmacy.

## 2017-09-04 ENCOUNTER — Ambulatory Visit: Payer: BLUE CROSS/BLUE SHIELD | Admitting: Family Medicine

## 2017-09-04 ENCOUNTER — Encounter: Payer: Self-pay | Admitting: Family Medicine

## 2017-09-04 VITALS — BP 108/68 | HR 80 | Temp 98.2°F | Wt 183.6 lb

## 2017-09-04 DIAGNOSIS — K921 Melena: Secondary | ICD-10-CM

## 2017-09-04 DIAGNOSIS — F4001 Agoraphobia with panic disorder: Secondary | ICD-10-CM | POA: Diagnosis not present

## 2017-09-04 DIAGNOSIS — K219 Gastro-esophageal reflux disease without esophagitis: Secondary | ICD-10-CM | POA: Diagnosis not present

## 2017-09-04 DIAGNOSIS — Z9889 Other specified postprocedural states: Secondary | ICD-10-CM | POA: Diagnosis not present

## 2017-09-04 NOTE — Progress Notes (Signed)
Patient: Bobby [name] Male    DOB: 06/06/1985   32 y.o.   MRN: [id number] Visit Date: 09/04/2017  Today's Provider: Dortha Kernennis Chrismon, PA   Chief Complaint  Patient presents with  . FMLA forms   Subjective:    HPI Patient presents today to request FMLA forms be completed. Patient states the forms have to be done every 6 months.     Past Medical History:  Diagnosis Date  . ADHD (attention deficit hyperactivity disorder)   . Anxiety   . SVT (supraventricular tachycardia) (HCC)    resolved since ablation  . WPW (Wolff-Parkinson-White syndrome)    resolved since ablation   Patient Active Problem List   Diagnosis Date Noted  . GERD (gastroesophageal reflux disease) 03/03/2017  . WPW (Wolff-Parkinson-White syndrome) 09/09/2014  . Blood in feces 09/02/2014  . Acid indigestion 09/02/2014  . ED (erectile dysfunction) of non-organic origin 09/02/2014  . Cannot sleep 09/02/2014  . Episodic paroxysmal anxiety disorder 09/02/2014  . Fast heart beat 09/02/2014  . Clicking jaw syndrome 09/02/2014  . Bilateral tinnitus 09/02/2014  . Arthralgia of shoulder 09/02/2014  . Ventricular pre-excitation with arrhythmia 09/02/2014  . History of hay fever 08/25/2014  . ADD (attention deficit disorder) 08/25/2014  . Anxiety, generalized 08/25/2014  . Insomnia, persistent 08/25/2014  . Panic disorder with agoraphobia 08/25/2014  . Anxiety 12/26/2013  . Paroxysmal supraventricular tachycardia (HCC) 10/29/2013  . Decreased libido 09/17/2009   Past Surgical History:  Procedure Laterality Date  . CARDIAC CATHETERIZATION    . TONSILLECTOMY  1994  . TONSILLECTOMY     Family History  Problem Relation Age of Onset  . Breast cancer Mother   . Ovarian cancer Mother   . Alcohol abuse Father   . Drug abuse Father   . Heart attack Maternal Grandfather   . Heart attack Paternal Grandfather   . Depression Brother    No Known Allergies  Current Outpatient Medications:  .  diazepam  (VALIUM) 10 MG tablet, TAKE 1 TABLET BY MOUTH EVERY DAY AS NEEDED, Disp: 30 tablet, Rfl: 0 .  pantoprazole (PROTONIX) 40 MG tablet, Take 1 tablet (40 mg total) by mouth daily., Disp: 30 tablet, Rfl: 3 .  sucralfate (CARAFATE) 1 g tablet, Take 1 tablet by mouth 2 (two) times daily as needed., Disp: , Rfl:  .  zolpidem (AMBIEN) 10 MG tablet, TAKE 1 TABLET BY MOUTH EVERY NIGHT AT BEDTIME, Disp: 30 tablet, Rfl: 0  Review of Systems  Constitutional: Negative.   Respiratory: Negative.   Cardiovascular: Negative.   Psychiatric/Behavioral: The patient is nervous/anxious.    Social History   Tobacco Use  . Smoking status: Never Smoker  . Smokeless tobacco: Never Used  Substance Use Topics  . Alcohol use: No    Alcohol/week: 0.0 oz   Objective:   BP 108/68 (BP Location: Right Arm, Patient Position: Sitting, Cuff Size: Normal)   Pulse 80   Temp 98.2 F (36.8 C) (Oral)   Wt 183 lb 9.6 oz (83.3 kg)   SpO2 97%   BMI 26.34 kg/m   Physical Exam  Constitutional: He is oriented to person, place, and time. He appears well-developed and well-nourished. No distress.  HENT:  Head: Normocephalic and atraumatic.  Right Ear: Hearing normal.  Left Ear: Hearing normal.  Nose: Nose normal.  Eyes: Conjunctivae and lids are normal. Right eye exhibits no discharge. Left eye exhibits no discharge. No scleral icterus.  Cardiovascular: Normal rate and regular rhythm.  Pulmonary/Chest: Effort  normal. No respiratory distress.  Musculoskeletal: Normal range of motion.  Neurological: He is alert and oriented to person, place, and time.  Skin: Skin is intact. No lesion and no rash noted.  Psychiatric: He has a normal mood and affect. His speech is normal and behavior is normal. Thought content normal.      Assessment & Plan:     1. Panic disorder with agoraphobia Still using Valium 10 mg 1/2 tablet 1-2 times a day as needed. Panic attacks and agoraphobia persists. Some worsening with daughter being born 3  months premature a month ago. Presently is in the [facility] NICU with 2 "hole in her heart". This and financial stress makes anxiety and panic attacks persistent. Completed FMLA form for intermittent leave due to overwhelming panic attacks and use of tranquilizing agent. Recommend scheduling CPE and labs in the next 2 months.  2. Blood in feces No recurrence of bleeding from anal fissure this year. Recommend extra fiber in diet and drinking plenty of water to maintain soft stools.  3. Gastroesophageal reflux disease, esophagitis presence not specified Has had persistent/recurrent dyspepsia and reflux controlled by use of Pantoprazole 40 mg qd as prescribed by Dr. Norma Fredrickson (gastroenterologist). Recommend follow up with him if uncontrolled or recurrence of hematemesis. May use Carafate 1 gm dissolve in 2 ounces of water and swallowed up to QID prn.  4. History of radiofrequency ablation procedure for cardiac arrhythmia History of WPW syndrome with SVT requiring cardiac ablation procedure May 2016 to finally abort arrhythmias. Followed by Dr. Juliann Pares (cardiologist) annually prn.       Dortha Kern, PA  [facility]

## 2017-09-18 ENCOUNTER — Other Ambulatory Visit: Payer: Self-pay | Admitting: Family Medicine

## 2017-09-18 DIAGNOSIS — F4001 Agoraphobia with panic disorder: Secondary | ICD-10-CM

## 2017-10-31 ENCOUNTER — Other Ambulatory Visit: Payer: Self-pay | Admitting: Family Medicine

## 2017-10-31 DIAGNOSIS — F4001 Agoraphobia with panic disorder: Secondary | ICD-10-CM

## 2017-11-03 ENCOUNTER — Encounter: Payer: Self-pay | Admitting: Family Medicine

## 2017-12-14 ENCOUNTER — Other Ambulatory Visit: Payer: Self-pay | Admitting: Family Medicine

## 2017-12-14 DIAGNOSIS — F4001 Agoraphobia with panic disorder: Secondary | ICD-10-CM

## 2017-12-29 ENCOUNTER — Encounter: Payer: Self-pay | Admitting: Family Medicine

## 2017-12-29 ENCOUNTER — Ambulatory Visit: Payer: BLUE CROSS/BLUE SHIELD | Admitting: Family Medicine

## 2017-12-29 VITALS — BP 107/70 | HR 92 | Temp 98.7°F | Wt 182.8 lb

## 2017-12-29 DIAGNOSIS — M545 Low back pain, unspecified: Secondary | ICD-10-CM

## 2017-12-29 DIAGNOSIS — Z113 Encounter for screening for infections with a predominantly sexual mode of transmission: Secondary | ICD-10-CM | POA: Diagnosis not present

## 2017-12-29 NOTE — Progress Notes (Signed)
Patient: Bobby [name] Male    DOB: [birth year]   32 y.o.   MRN: [id number] Visit Date: 12/29/2017  Today's Provider: Dortha Kern, PA   Chief Complaint  Patient presents with  . Back Pain   Subjective:    HPI Back Pain Patient presents today for back pain that has been going on for years now per patient. Pain states that the symptoms are dull and achy. Patient states he think that his kidneys might be a factor of his back pain.    Past Medical History:  Diagnosis Date  . ADHD (attention deficit hyperactivity disorder)   . Anxiety   . SVT (supraventricular tachycardia) (HCC)    resolved since ablation  . WPW (Wolff-Parkinson-White syndrome)    resolved since ablation   Past Surgical History:  Procedure Laterality Date  . CARDIAC CATHETERIZATION    . TONSILLECTOMY  1994  . TONSILLECTOMY     Family History  Problem Relation Age of Onset  . Breast cancer Mother   . Ovarian cancer Mother   . Alcohol abuse Father   . Drug abuse Father   . Heart attack Maternal Grandfather   . Heart attack Paternal Grandfather   . Depression Brother    No Known Allergies  Current Outpatient Medications:  .  diazepam (VALIUM) 10 MG tablet, TAKE 1 TABLET BY MOUTH EVERY DAY AS NEEDED, Disp: 30 tablet, Rfl: 0 .  pantoprazole (PROTONIX) 40 MG tablet, Take 1 tablet (40 mg total) by mouth daily., Disp: 30 tablet, Rfl: 3 .  sucralfate (CARAFATE) 1 g tablet, Take 1 tablet by mouth 2 (two) times daily as needed., Disp: , Rfl:  .  zolpidem (AMBIEN) 10 MG tablet, TAKE 1 TABLET BY MOUTH EVERY NIGHT AT BEDTIME, Disp: 30 tablet, Rfl: 0  Review of Systems  Constitutional: Negative.   HENT: Negative.   Respiratory: Negative.   Cardiovascular: Negative.   Gastrointestinal: Positive for abdominal pain.  Musculoskeletal: Positive for back pain.  Allergic/Immunologic: Negative.   Neurological: Negative.   Psychiatric/Behavioral: Negative.    Social History   Tobacco Use  . Smoking  status: Never Smoker  . Smokeless tobacco: Never Used  Substance Use Topics  . Alcohol use: No    Alcohol/week: 0.0 standard drinks   Objective:   BP 107/70 (BP Location: Right Arm, Patient Position: Sitting, Cuff Size: Normal)   Pulse 92   Temp 98.7 F (37.1 C) (Oral)   Wt 182 lb 12.8 oz (82.9 kg)   SpO2 98%   BMI 26.23 kg/m  Vitals:   12/29/17 1318  BP: 107/70  Pulse: 92  Temp: 98.7 F (37.1 C)  TempSrc: Oral  SpO2: 98%  Weight: 182 lb 12.8 oz (82.9 kg)   Physical Exam  Constitutional: He is oriented to person, place, and time. He appears well-developed and well-nourished. No distress.  HENT:  Head: Normocephalic and atraumatic.  Right Ear: Hearing normal.  Left Ear: Hearing normal.  Nose: Nose normal.  Eyes: Conjunctivae and lids are normal. Right eye exhibits no discharge. Left eye exhibits no discharge. No scleral icterus.  Neck: Neck supple.  Cardiovascular: Normal rate and regular rhythm.  Pulmonary/Chest: Effort normal and breath sounds normal. No respiratory distress.  Abdominal: Soft. Bowel sounds are normal.  Genitourinary: Penis normal.  Genitourinary Comments: No local lymphadenopathy or discharge from penis.  Musculoskeletal: Normal range of motion.  Neurological: He is alert and oriented to person, place, and time. He displays normal reflexes.  Skin: Skin  is intact. No lesion and no rash noted.  Multiple tattoos scattered on right arm and left wrist.  Psychiatric: He has a normal mood and affect. His speech is normal and behavior is normal. Thought content normal.      Assessment & Plan:     1. Acute right-sided low back pain without sciatica Intermittent aching pain in right mid back without specific injury. No radiation of pain or numbness. Denies hematuria or urinary frequency. Worried about possible kidney function problems. Will check labs and encouraged to work on back strain rehab exercises. - CBC with Differential/Platelet - Comprehensive  metabolic panel  2. Screening for STD (sexually transmitted disease) Most recent sexual intercourse was 2 weeks ago and concerned about possible STD. Requests tests for possible infections. No burning with urination, fever, joint pains or urethral discharge. Recheck pending reports.  - GC/Chlamydia Probe Amp([facility]) - CBC with Differential/Platelet - Comprehensive metabolic panel - HIV Antibody (routine testing w rflx)       Dortha Kern, PA  [facility]

## 2017-12-29 NOTE — Patient Instructions (Signed)

## 2017-12-29 NOTE — Addendum Note (Signed)
Addended by: Jeanene Erb on: 12/29/2017 03:50 PM   Modules accepted: Orders

## 2017-12-29 NOTE — Addendum Note (Signed)
Addended by: Jeanene Erb on: 12/29/2017 03:39 PM   Modules accepted: Orders

## 2017-12-30 LAB — CBC WITH DIFFERENTIAL/PLATELET
Basophils Absolute: 0 10*3/uL (ref 0.0–0.2)
Basos: 0 %
EOS (ABSOLUTE): 0.2 10*3/uL (ref 0.0–0.4)
Eos: 2 %
HEMOGLOBIN: 15.6 g/dL (ref 13.0–17.7)
Hematocrit: 45.6 % (ref 37.5–51.0)
IMMATURE GRANS (ABS): 0.1 10*3/uL (ref 0.0–0.1)
IMMATURE GRANULOCYTES: 1 %
LYMPHS: 30 %
Lymphocytes Absolute: 2.6 10*3/uL (ref 0.7–3.1)
MCH: 30.4 pg (ref 26.6–33.0)
MCHC: 34.2 g/dL (ref 31.5–35.7)
MCV: 89 fL (ref 79–97)
MONOCYTES: 7 %
MONOS ABS: 0.6 10*3/uL (ref 0.1–0.9)
NEUTROS PCT: 60 %
Neutrophils Absolute: 5.1 10*3/uL (ref 1.4–7.0)
Platelets: 332 10*3/uL (ref 150–450)
RBC: 5.14 x10E6/uL (ref 4.14–5.80)
RDW: 12.4 % (ref 12.3–15.4)
WBC: 8.5 10*3/uL (ref 3.4–10.8)

## 2017-12-30 LAB — COMPREHENSIVE METABOLIC PANEL
ALT: 47 IU/L — AB (ref 0–44)
AST: 26 IU/L (ref 0–40)
Albumin/Globulin Ratio: 2.1 (ref 1.2–2.2)
Albumin: 4.9 g/dL (ref 3.5–5.5)
Alkaline Phosphatase: 100 IU/L (ref 39–117)
BUN/Creatinine Ratio: 8 — ABNORMAL LOW (ref 9–20)
BUN: 8 mg/dL (ref 6–20)
Bilirubin Total: 1 mg/dL (ref 0.0–1.2)
CALCIUM: 9.7 mg/dL (ref 8.7–10.2)
CO2: 25 mmol/L (ref 20–29)
CREATININE: 0.96 mg/dL (ref 0.76–1.27)
Chloride: 101 mmol/L (ref 96–106)
GFR calc Af Amer: 122 mL/min/{1.73_m2} (ref >59)
GFR, EST NON AFRICAN AMERICAN: 106 mL/min/{1.73_m2} (ref >59)
GLOBULIN, TOTAL: 2.3 g/dL (ref 1.5–4.5)
Glucose: 83 mg/dL (ref 65–99)
Potassium: 4.2 mmol/L (ref 3.5–5.2)
SODIUM: 142 mmol/L (ref 134–144)
Total Protein: 7.2 g/dL (ref 6.0–8.5)

## 2017-12-30 LAB — HIV ANTIBODY (ROUTINE TESTING W REFLEX): HIV Screen 4th Generation wRfx: NONREACTIVE

## 2018-01-01 ENCOUNTER — Telehealth: Payer: Self-pay

## 2018-01-01 NOTE — Telephone Encounter (Signed)
Patient was advised.,PC 

## 2018-01-01 NOTE — Telephone Encounter (Signed)
-----   Message from Tamsen Roers, [place] sent at 01/01/2018  7:26 AM EDT ----- Preliminary lab reports are normal. Awaiting final result of STD tests.

## 2018-01-02 LAB — GC/CHLAMYDIA PROBE AMP
Chlamydia trachomatis, NAA: NEGATIVE
NEISSERIA GONORRHOEAE BY PCR: NEGATIVE

## 2018-01-24 ENCOUNTER — Emergency Department: Payer: BLUE CROSS/BLUE SHIELD

## 2018-01-24 ENCOUNTER — Other Ambulatory Visit: Payer: Self-pay

## 2018-01-24 ENCOUNTER — Emergency Department
Admission: EM | Admit: 2018-01-24 | Discharge: 2018-01-24 | Disposition: A | Discharge status: Home / Self Care | Payer: BLUE CROSS/BLUE SHIELD | Attending: Emergency Medicine | Admitting: Emergency Medicine

## 2018-01-24 ENCOUNTER — Encounter: Payer: Self-pay | Admitting: Emergency Medicine

## 2018-01-24 DIAGNOSIS — Z79899 Other long term (current) drug therapy: Secondary | ICD-10-CM | POA: Insufficient documentation

## 2018-01-24 DIAGNOSIS — N50812 Left testicular pain: Secondary | ICD-10-CM | POA: Diagnosis not present

## 2018-01-24 DIAGNOSIS — N503 Cyst of epididymis: Secondary | ICD-10-CM | POA: Diagnosis not present

## 2018-01-24 LAB — URINALYSIS, COMPLETE (UACMP) WITH MICROSCOPIC
BACTERIA UA: NONE SEEN
Bilirubin Urine: NEGATIVE
GLUCOSE, UA: NEGATIVE mg/dL
HGB URINE DIPSTICK: NEGATIVE
KETONES UR: NEGATIVE mg/dL
Leukocytes, UA: NEGATIVE
Nitrite: NEGATIVE
Protein, ur: NEGATIVE mg/dL
Specific Gravity, Urine: 1.017 (ref 1.005–1.030)
Squamous Epithelial / LPF: NONE SEEN (ref 0–5)
WBC, UA: NONE SEEN WBC/hpf (ref 0–5)
pH: 7 (ref 5.0–8.0)

## 2018-01-24 LAB — CHLAMYDIA/NGC RT PCR (ARMC ONLY)
Chlamydia Tr: NOT DETECTED
N gonorrhoeae: NOT DETECTED

## 2018-01-24 NOTE — ED Triage Notes (Signed)
C/O left testicular pain.  Onset of pain this am at 0230.  Denies dysuria

## 2018-01-24 NOTE — ED Provider Notes (Signed)
[facility] Emergency Department Provider Note   ____________________________________________   First MD Initiated Contact with Patient 01/24/18 0957     (approximate)  I have reviewed the triage vital signs and the nursing notes.   HISTORY  Chief Complaint Testicle Pain    HPI Bobby [name] is a 32 y.o. male who woke up this morning with pain in his testicle.  Is worse if he moves.  He reports yesterday his nephew and niece were jumping around on the bed he thought maybe his knee stepped on his privates.  Was not hurting much yesterday but got worse today.  Ultrasound was negative.  Patient had no pain up until this morning.  Patient is currently not hurting.   Past Medical History:  Diagnosis Date  . ADHD (attention deficit hyperactivity disorder)   . Anxiety   . SVT (supraventricular tachycardia) (HCC)    resolved since ablation  . WPW (Wolff-Parkinson-White syndrome)    resolved since ablation    Patient Active Problem List   Diagnosis Date Noted  . GERD (gastroesophageal reflux disease) 03/03/2017  . WPW (Wolff-Parkinson-White syndrome) 09/09/2014  . Blood in feces 09/02/2014  . Acid indigestion 09/02/2014  . ED (erectile dysfunction) of non-organic origin 09/02/2014  . Cannot sleep 09/02/2014  . Episodic paroxysmal anxiety disorder 09/02/2014  . Fast heart beat 09/02/2014  . Clicking jaw syndrome 09/02/2014  . Bilateral tinnitus 09/02/2014  . Arthralgia of shoulder 09/02/2014  . Ventricular pre-excitation with arrhythmia 09/02/2014  . History of hay fever 08/25/2014  . ADD (attention deficit disorder) 08/25/2014  . Anxiety, generalized 08/25/2014  . Insomnia, persistent 08/25/2014  . Panic disorder with agoraphobia 08/25/2014  . Anxiety 12/26/2013  . Paroxysmal supraventricular tachycardia (HCC) 10/29/2013  . Decreased libido 09/17/2009    Past Surgical History:  Procedure Laterality Date  . CARDIAC CATHETERIZATION    .  TONSILLECTOMY  1994  . TONSILLECTOMY      Prior to Admission medications   Medication Sig Start Date End Date Taking? Authorizing Provider  diazepam (VALIUM) 10 MG tablet TAKE 1 TABLET BY MOUTH EVERY DAY AS NEEDED 12/14/17   Chrismon, Jodell Cipro, PA  pantoprazole (PROTONIX) 40 MG tablet Take 1 tablet (40 mg total) by mouth daily. 03/03/17   Chrismon, Jodell Cipro, PA  sucralfate (CARAFATE) 1 g tablet Take 1 tablet by mouth 2 (two) times daily as needed. 07/18/16   [provider]  zolpidem (AMBIEN) 10 MG tablet TAKE 1 TABLET BY MOUTH EVERY NIGHT AT BEDTIME 06/27/17   Chrismon, Jodell Cipro, PA    Allergies Patient has no known allergies.  Family History  Problem Relation Age of Onset  . Breast cancer Mother   . Ovarian cancer Mother   . Alcohol abuse Father   . Drug abuse Father   . Heart attack Maternal Grandfather   . Heart attack Paternal Grandfather   . Depression Brother     Social History Social History   Tobacco Use  . Smoking status: Never Smoker  . Smokeless tobacco: Never Used  Substance Use Topics  . Alcohol use: No    Alcohol/week: 0.0 standard drinks  . Drug use: No    Review of Systems  Constitutional: No fever/chills Eyes: No visual changes. ENT: No sore throat. Cardiovascular: Denies chest pain. Respiratory: Denies shortness of breath. Gastrointestinal: No abdominal pain.  No nausea, no vomiting.  No diarrhea.  No constipation. Genitourinary: Negative for dysuria. Musculoskeletal: Negative for back pain. Skin: Negative for rash. Neurological: Negative  for headaches, focal weakness   ____________________________________________   PHYSICAL EXAM:  VITAL SIGNS: ED Triage Vitals  Enc Vitals Group     BP 01/24/18 0838 128/86     Pulse Rate 01/24/18 0838 77     Resp 01/24/18 0838 16     Temp 01/24/18 0838 98.3 F (36.8 C)     Temp Source 01/24/18 0838 Oral     SpO2 01/24/18 0838 97 %     Weight 01/24/18 0834 168 lb (76.2 kg)     Height 01/24/18  0834 5\' 10"  (1.778 m)     Head Circumference --      Peak Flow --      Pain Score 01/24/18 0834 6     Pain Loc --      Pain Edu? --      Excl. in GC? --     Constitutional: Alert and oriented. Well appearing and in no acute distress. Eyes: Conjunctivae are normal.  Head: Atraumatic. Nose: No congestion/rhinnorhea. Mouth/Throat: Mucous membranes are moist.  Oropharynx non-erythematous. Neck: No stridor.  Cardiovascular: Normal rate, regular rhythm.   Good peripheral circulation. Respiratory: Normal respiratory effort.  . GU: Testicles are normal bilaterally there is no bruising swelling or tenderness at this time. Gastrointestinal: Soft and nontender. No distention. No abdominal bruits. No CVA tenderness. Musculoskeletal: No lower extremity tenderness nor edema.  Neurologic:  Normal speech and language. No gross focal neurologic deficits are appreciated.  Skin:  Skin is warm, dry and intact. No rash noted. Psychiatric: Mood and affect are normal. Speech and behavior are normal.  ____________________________________________   LABS (all labs ordered are listed, but only abnormal results are displayed)  Labs Reviewed  CHLAMYDIA/NGC RT PCR ([facility] ONLY)  URINALYSIS, COMPLETE (UACMP) WITH MICROSCOPIC   ____________________________________________  EKG   ____________________________________________  RADIOLOGY  ED MD interpretation: Ultrasound of both testicles is essentially normal  Official radiology report(s): Koreas Scrotum W/doppler  Result Date: 01/24/2018 CLINICAL DATA:  Left testicular pain EXAM: SCROTAL ULTRASOUND DOPPLER ULTRASOUND OF THE TESTICLES TECHNIQUE: Complete ultrasound examination of the testicles, epididymis, and other scrotal structures was performed. Color and spectral Doppler ultrasound were also utilized to evaluate blood flow to the testicles. COMPARISON:  None. FINDINGS: Right testicle Measurements: 4.0 x 2.2 x 3.2 cm. No mass or microlithiasis  visualized. Left testicle Measurements: 4.2 x 2.2 x 3.1 cm. No mass or microlithiasis visualized. Right epididymis:  6 mm cyst, otherwise unremarkable Left epididymis:  Normal in size and appearance. Hydrocele:  None visualized. Varicocele:  None visualized. Pulsed Doppler interrogation of both testes demonstrates normal low resistance arterial and venous waveforms bilaterally. IMPRESSION: No testicular abnormality or evidence of torsion. Small right epididymal head cyst. No acute findings. Electronically Signed   By: Charlett NoseKevin  Dover M.D.   On: 01/24/2018 09:21    ____________________________________________   PROCEDURES  Procedure(s) performed:   Procedures  Critical Care performed:   ____________________________________________   INITIAL IMPRESSION / ASSESSMENT AND PLAN / ED COURSE  Patient is not currently having any pain.  He has a history of mild trauma which could explain the pain.  Urine is clear I will plan on discharging this gentleman.  He can use a little bit of Motrin or Tylenol for mild pain if he has any and return if it is worse.      ____________________________________________   FINAL CLINICAL IMPRESSION(S) / ED DIAGNOSES  Final diagnoses:  Pain in left testicle     ED Discharge Orders    None  Note:  This document was prepared using Dragon voice recognition software and may include unintentional dictation errors.    Arnaldo Natal, MD 01/24/18 [id number]

## 2018-01-24 NOTE — Discharge Instructions (Addendum)
Please return for severe pain fever vomiting or swelling.  For mild pain you can take Tylenol or Motrin for day or 2.

## 2018-01-30 ENCOUNTER — Other Ambulatory Visit: Payer: Self-pay | Admitting: Family Medicine

## 2018-01-30 DIAGNOSIS — F4001 Agoraphobia with panic disorder: Secondary | ICD-10-CM

## 2018-03-12 ENCOUNTER — Ambulatory Visit (INDEPENDENT_AMBULATORY_CARE_PROVIDER_SITE_OTHER): Payer: BLUE CROSS/BLUE SHIELD | Admitting: Family Medicine

## 2018-03-12 ENCOUNTER — Encounter: Payer: Self-pay | Admitting: Family Medicine

## 2018-03-12 VITALS — BP 108/60 | HR 91 | Temp 98.3°F | Resp 16 | Wt 184.2 lb

## 2018-03-12 DIAGNOSIS — F4001 Agoraphobia with panic disorder: Secondary | ICD-10-CM | POA: Diagnosis not present

## 2018-03-12 DIAGNOSIS — K219 Gastro-esophageal reflux disease without esophagitis: Secondary | ICD-10-CM

## 2018-03-12 NOTE — Progress Notes (Signed)
Patient: Bobby ChafeJohn Alan Capaldi Male    DOB: 12/13/1985   33 y.o.   MRN: [id number] Visit Date: 03/12/2018  Today's Provider: Dortha Kernennis Kenli Waldo, PA   Chief Complaint  Patient presents with  . Follow-up    FMLA Forms   Subjective:     HPI  Patient here today requesting renewal on his FMLA forms. Forms are to done every 6 months.  Panic Disorder with agoraphobia: Patient currently stable on Valium 10 mg 1/2 tablet 1-2 times a day as needed.  Past Medical History:  Diagnosis Date  . ADHD (attention deficit hyperactivity disorder)   . Anxiety   . SVT (supraventricular tachycardia) (HCC)    resolved since ablation  . WPW (Wolff-Parkinson-White syndrome)    resolved since ablation   Past Surgical History:  Procedure Laterality Date  . CARDIAC CATHETERIZATION    . TONSILLECTOMY  1994  . TONSILLECTOMY     Family History  Problem Relation Age of Onset  . Breast cancer Mother   . Ovarian cancer Mother   . Alcohol abuse Father   . Drug abuse Father   . Heart attack Maternal Grandfather   . Heart attack Paternal Grandfather   . Depression Brother    No Known Allergies  Current Outpatient Medications:  .  diazepam (VALIUM) 10 MG tablet, TAKE 1 TABLET BY MOUTH EVERY DAY AS NEEDED, Disp: 30 tablet, Rfl: 0 .  pantoprazole (PROTONIX) 40 MG tablet, Take 1 tablet (40 mg total) by mouth daily., Disp: 30 tablet, Rfl: 3 .  sucralfate (CARAFATE) 1 g tablet, Take 1 tablet by mouth 2 (two) times daily as needed., Disp: , Rfl:  .  zolpidem (AMBIEN) 10 MG tablet, TAKE 1 TABLET BY MOUTH EVERY NIGHT AT BEDTIME (Patient not taking: Reported on 01/24/2018), Disp: 30 tablet, Rfl: 0  Review of Systems  Constitutional: Negative.   HENT: Negative.   Eyes: Negative.   Respiratory: Negative.   Gastrointestinal: Negative.   Genitourinary: Negative.   Psychiatric/Behavioral: The patient is nervous/anxious.        Had a panic attack triggered by an iced coffee with a sensation similar to past WPW     Social History   Tobacco Use  . Smoking status: Never Smoker  . Smokeless tobacco: Never Used  Substance Use Topics  . Alcohol use: No    Alcohol/week: 0.0 standard drinks     Objective:   BP 108/60 (BP Location: Right Arm, Patient Position: Sitting, Cuff Size: Large)   Pulse 91   Temp 98.3 F (36.8 C) (Oral)   Resp 16   Wt 184 lb 3.2 oz (83.6 kg)   SpO2 98%   BMI 26.43 kg/m  Vitals:   03/12/18 1349  BP: 108/60  Pulse: 91  Resp: 16  Temp: 98.3 F (36.8 C)  TempSrc: Oral  SpO2: 98%  Weight: 184 lb 3.2 oz (83.6 kg)   Physical Exam Constitutional:      General: He is not in acute distress.    Appearance: He is well-developed.  HENT:     Head: Normocephalic and atraumatic.     Right Ear: Hearing normal.     Left Ear: Hearing normal.     Nose: Nose normal.  Eyes:     General: Lids are normal. No scleral icterus.       Right eye: No discharge.        Left eye: No discharge.     Conjunctiva/sclera: Conjunctivae normal.  Neck:  Musculoskeletal: Neck supple.  Cardiovascular:     Rate and Rhythm: Normal rate and regular rhythm.     Heart sounds: Normal heart sounds.  Pulmonary:     Effort: Pulmonary effort is normal. No respiratory distress.  Abdominal:     General: Bowel sounds are normal.     Palpations: Abdomen is soft.  Musculoskeletal: Normal range of motion.  Skin:    Findings: No lesion or rash.  Neurological:     Mental Status: He is alert and oriented to person, place, and time.  Psychiatric:        Speech: Speech normal.        Behavior: Behavior normal.        Thought Content: Thought content normal.       Assessment & Plan    1. Panic disorder with agoraphobia Intermittent panic attacks with agoraphobia. Taking Valium 10 mg once a day prn has helped to control strong symptoms and allowed him to go with family for a reunion for Christmas. Has had symptoms of nearly having tachycardia similar to before the ablation therapy for WPW. Advised  he may have to consider a beta blocker if palpitations/tachycardia recurs and consider follow up with cardiologist. Completed FMLA forms for work regarding occasional need for time away from work to treat panic attacks at home, in the office or in the ER.  2. Gastroesophageal reflux disease, esophagitis presence not specified No hematemesis, melena or hematochezia. Some indigestion that is controlled with PPI. Has not had to use the Carafate in several months. If it becomes more persistent, should consider referral to gastroenterologist for upper endoscopy.     Dortha Kern, PA  [facility]

## 2018-03-20 ENCOUNTER — Encounter: Payer: Self-pay | Admitting: Family Medicine

## 2018-03-21 ENCOUNTER — Telehealth: Payer: Self-pay

## 2018-03-21 ENCOUNTER — Encounter: Payer: Self-pay | Admitting: Family Medicine

## 2018-03-21 NOTE — Telephone Encounter (Signed)
Patient would like return call from CMA to discuss some FMLA paperwork Call back number is [phone]

## 2018-03-21 NOTE — Telephone Encounter (Signed)
Pt calling back asking Joseline to call him back to let him know if fax was received. ASAP  Thanks, [facility]

## 2018-03-21 NOTE — Telephone Encounter (Signed)
Spoke to patient and asked if [employer] can Refaxed the the forms because we have not receive the paper work

## 2018-03-27 ENCOUNTER — Other Ambulatory Visit: Payer: Self-pay | Admitting: Family Medicine

## 2018-03-27 ENCOUNTER — Encounter: Payer: Self-pay | Admitting: Family Medicine

## 2018-03-27 DIAGNOSIS — F4001 Agoraphobia with panic disorder: Secondary | ICD-10-CM

## 2018-03-27 MED ORDER — DIAZEPAM 10 MG PO TABS: 10.0000 mg | ORAL_TABLET | Freq: Every day | ORAL | 0 refills | Status: DC | PRN

## 2018-05-28 ENCOUNTER — Other Ambulatory Visit: Payer: Self-pay | Admitting: Family Medicine

## 2018-05-28 DIAGNOSIS — F4001 Agoraphobia with panic disorder: Secondary | ICD-10-CM

## 2018-05-29 ENCOUNTER — Encounter: Payer: Self-pay | Admitting: Family Medicine

## 2018-05-29 ENCOUNTER — Other Ambulatory Visit: Payer: Self-pay

## 2018-05-29 ENCOUNTER — Ambulatory Visit: Payer: BLUE CROSS/BLUE SHIELD | Admitting: Family Medicine

## 2018-05-29 ENCOUNTER — Telehealth: Payer: BLUE CROSS/BLUE SHIELD | Admitting: Physician Assistant

## 2018-05-29 ENCOUNTER — Telehealth: Payer: Self-pay | Admitting: *Deleted

## 2018-05-29 VITALS — BP 98/64 | HR 123 | Temp 98.5°F | Resp 16 | Wt 185.0 lb

## 2018-05-29 DIAGNOSIS — R300 Dysuria: Secondary | ICD-10-CM

## 2018-05-29 DIAGNOSIS — R369 Urethral discharge, unspecified: Secondary | ICD-10-CM | POA: Diagnosis not present

## 2018-05-29 LAB — POCT URINALYSIS DIPSTICK
APPEARANCE: ABNORMAL
BILIRUBIN UA: NEGATIVE
Glucose, UA: NEGATIVE
Ketones, UA: NEGATIVE
Nitrite, UA: NEGATIVE
Odor: NORMAL
PH UA: 6 (ref 5.0–8.0)
Protein, UA: POSITIVE — AB
Spec Grav, UA: 1.02 (ref 1.010–1.025)
UROBILINOGEN UA: 1 U/dL

## 2018-05-29 NOTE — Progress Notes (Signed)
Patient: Bobby [name] Male    DOB: 09/22/1985   33 y.o.   MRN: [id number] Visit Date: 05/29/2018  Today's Provider: Dortha Kern, PA   Chief Complaint  Patient presents with  . Dysuria   Subjective:     Dysuria   This is a new problem. The current episode started in the past 7 days (2 days). The quality of the pain is described as burning. The pain is mild. There has been no fever. Pertinent negatives include no chills, discharge, flank pain, frequency, hematuria, hesitancy, nausea, possible pregnancy, sweats, urgency or vomiting. He has tried nothing for the symptoms.   Patient has had burning upon urination for 2 days. Patient states he has some pain right after urination but no other symptoms.   Past Medical History:  Diagnosis Date  . ADHD (attention deficit hyperactivity disorder)   . Anxiety   . SVT (supraventricular tachycardia) (HCC)    resolved since ablation  . WPW (Wolff-Parkinson-White syndrome)    resolved since ablation   Patient Active Problem List   Diagnosis Date Noted  . GERD (gastroesophageal reflux disease) 03/03/2017  . WPW (Wolff-Parkinson-White syndrome) 09/09/2014  . Blood in feces 09/02/2014  . Acid indigestion 09/02/2014  . ED (erectile dysfunction) of non-organic origin 09/02/2014  . Cannot sleep 09/02/2014  . Episodic paroxysmal anxiety disorder 09/02/2014  . Fast heart beat 09/02/2014  . Clicking jaw syndrome 09/02/2014  . Bilateral tinnitus 09/02/2014  . Arthralgia of shoulder 09/02/2014  . Ventricular pre-excitation with arrhythmia 09/02/2014  . History of hay fever 08/25/2014  . ADD (attention deficit disorder) 08/25/2014  . Anxiety, generalized 08/25/2014  . Insomnia, persistent 08/25/2014  . Panic disorder with agoraphobia 08/25/2014  . Anxiety 12/26/2013  . Paroxysmal supraventricular tachycardia (HCC) 10/29/2013  . Decreased libido 09/17/2009   Past Surgical History:  Procedure Laterality Date  . CARDIAC  CATHETERIZATION    . TONSILLECTOMY  1994  . TONSILLECTOMY     Family History  Problem Relation Age of Onset  . Breast cancer Mother   . Ovarian cancer Mother   . Alcohol abuse Father   . Drug abuse Father   . Heart attack Maternal Grandfather   . Heart attack Paternal Grandfather   . Depression Brother    No Known Allergies  Current Outpatient Medications:  .  diazepam (VALIUM) 10 MG tablet, TAKE 1 TABLET BY MOUTH DAILY AS NEEDED, Disp: 30 tablet, Rfl: 0 .  pantoprazole (PROTONIX) 40 MG tablet, Take 1 tablet (40 mg total) by mouth daily., Disp: 30 tablet, Rfl: 3 .  sucralfate (CARAFATE) 1 g tablet, Take 1 tablet by mouth 2 (two) times daily as needed., Disp: , Rfl:  .  zolpidem (AMBIEN) 10 MG tablet, TAKE 1 TABLET BY MOUTH EVERY NIGHT AT BEDTIME (Patient not taking: Reported on 01/24/2018), Disp: 30 tablet, Rfl: 0  Review of Systems  Constitutional: Negative for appetite change, chills and fever.  Respiratory: Negative for chest tightness, shortness of breath and wheezing.   Cardiovascular: Negative for chest pain and palpitations.  Gastrointestinal: Negative for abdominal pain, nausea and vomiting.  Genitourinary: Positive for dysuria. Negative for flank pain, frequency, hematuria, hesitancy and urgency.   Social History   Tobacco Use  . Smoking status: Never Smoker  . Smokeless tobacco: Never Used  Substance Use Topics  . Alcohol use: No    Alcohol/week: 0.0 standard drinks     Objective:   BP 98/64 (BP Location: Right Arm, Patient Position: Sitting, Cuff  Size: Large)   Pulse (!) 123   Temp 98.5 F (36.9 C) (Oral)   Resp 16   Wt 185 lb (83.9 kg)   SpO2 98%   BMI 26.54 kg/m    Wt Readings from Last 3 Encounters:  05/29/18 185 lb (83.9 kg)  03/12/18 184 lb 3.2 oz (83.6 kg)  01/24/18 168 lb (76.2 kg)   Vitals:   05/29/18 1548  BP: 98/64  Pulse: (!) 123  Resp: 16  Temp: 98.5 F (36.9 C)  TempSrc: Oral  SpO2: 98%  Weight: 185 lb (83.9 kg)   Physical Exam  Constitutional:      General: He is not in acute distress.    Appearance: He is well-developed.  HENT:     Head: Normocephalic and atraumatic.     Right Ear: Hearing normal.     Left Ear: Hearing normal.     Nose: Nose normal.  Eyes:     General: Lids are normal. No scleral icterus.       Right eye: No discharge.        Left eye: No discharge.     Conjunctiva/sclera: Conjunctivae normal.  Neck:     Musculoskeletal: Neck supple.  Cardiovascular:     Rate and Rhythm: Tachycardia present.  Pulmonary:     Effort: Pulmonary effort is normal. No respiratory distress.  Abdominal:     General: Bowel sounds are normal.     Palpations: Abdomen is soft.  Genitourinary:    Comments: Tender urethra at the distal penis near os with purulent thick discharge expressed with palpation. Musculoskeletal: Normal range of motion.  Lymphadenopathy:     Cervical: No cervical adenopathy.  Skin:    Findings: No lesion or rash.  Neurological:     Mental Status: He is alert and oriented to person, place, and time.  Psychiatric:        Speech: Speech normal.        Behavior: Behavior normal.        Thought Content: Thought content normal.       Assessment & Plan    1. Dysuria Onset over the past 2 days. Denies fever or arthralgias. Went to the [facility] and given Cipro 500 mg BID. Some blood and leukocytes on dipstick. Microscopic exam showed [facility] WBC's with 1-2+ bacteria per hpf. AVS indicated test for GC and Chlamydia is pending. Patient was very anxious about discomfort and wanted this second opinion exam. Will start C&S and encouraged to drink extra fluids. - POCT urinalysis dipstick - CULTURE, URINE COMPREHENSIVE  2. Urethral discharge in male Dysuria only symptom the past 2 days. Did not realize he was having any discharge until this exam showed purulent material expressed from urethral os. Suspect GC and advised patient to notify sexual partner (last contact was last week) needed  to be examined and treated. Made him aware the health department would need to be notified with GC test is positive.     Dortha Kern, PA  [facility]

## 2018-05-29 NOTE — Telephone Encounter (Signed)
Appt scheduled

## 2018-05-29 NOTE — Progress Notes (Signed)
Based on what you shared with me, I feel your condition warrants further evaluation and I recommend that you be seen for a face to face office visit.  Ms. Bobby [name],  You symptoms suggest a urinary tract infection, however, since UTIs are not common in males, the antibiotic to use must be tailored specific to the causative bacteria. Therefore, a culture of your urine to show exactly which bacteria caused the infection must be done, and thus I recommend having an in office visit to have further workup, including the urine culture.     NOTE: If you entered your credit card information for this eVisit, you will not be charged. You may see a "hold" on your card for the $35 but that hold will drop off and you will not have a charge processed.  If you are having a true medical emergency please call 911.  If you need an urgent face to face visit, [place] has four urgent care centers for your convenience.    PLEASE NOTE: THE [facility] LOCATIONS AND URGENT CARE CLINICS DO NOT HAVE THE TESTING FOR CORONAVIRUS COVID19 AVAILABLE.  IF YOU FEEL YOU NEED THIS TEST YOU MUST HAVE AN ORDER TO GO TO A TESTING LOCATION FROM YOUR PROVIDER OR FROM A SCREENING E-VISIT     [email or web address] to reserve your spot online an avoid wait times  [facility] [address], [address] [place], [place] [address] 8 am to 8 pm Monday-Friday 10 am to 4 pm Saturday-Sunday *Across the street from [employer]  [address] [place], [address] 8 am to 5 pm Monday-Friday * In the [facility] on the [facility]   The following sites will take your insurance:  . [facility]  [phone] Get Driving Directions Find a Provider at this Location  [address] [place], [place] [address] . 10 am to 8 pm Monday-Friday . 12 pm to 8 pm Saturday-Sunday   . [facility]  [phone] Get Driving Directions  Find a Provider at this Location  [address] [place], [place] [address] . 8 am to 8 pm Monday-Friday . 9 am to 6 pm Saturday . 11 am to 6 pm Sunday   . [facility] at [facility]  [phone] Get Driving Directions  [address] [place], [place] [address] . 8 am to 8 pm Monday-Friday . 8 am to 4 pm Saturday-Sunday   Your e-visit answers were reviewed by a board certified advanced clinical practitioner to complete your personal care plan.  Thank you for using e-Visits.   I have spent 7 min in completion and review of this note- [facility]

## 2018-05-29 NOTE — Telephone Encounter (Signed)
Received message from call center where pt had called concerning urinary symptoms he is having. Called pt to schedule an ov for urinary symptoms. LMOVM for pt to return call.

## 2018-05-30 ENCOUNTER — Encounter: Payer: Self-pay | Admitting: Family Medicine

## 2018-05-31 ENCOUNTER — Other Ambulatory Visit: Payer: Self-pay

## 2018-05-31 ENCOUNTER — Encounter: Payer: Self-pay | Admitting: Family Medicine

## 2018-05-31 ENCOUNTER — Ambulatory Visit: Payer: BLUE CROSS/BLUE SHIELD | Admitting: Family Medicine

## 2018-05-31 VITALS — BP 98/58 | HR 83 | Temp 98.2°F | Resp 16

## 2018-05-31 DIAGNOSIS — R369 Urethral discharge, unspecified: Secondary | ICD-10-CM | POA: Diagnosis not present

## 2018-05-31 LAB — CULTURE, URINE COMPREHENSIVE

## 2018-05-31 NOTE — Progress Notes (Signed)
Patient: Bobby [name] Male    DOB: 15-Jan-1986   33 y.o.   MRN: [id number] Visit Date: 05/31/2018  Today's Provider: Dortha Kern, PA   Chief Complaint  Patient presents with  . Follow-up   Subjective:     HPI   Dysuria From 05/29/2018-started C&S and encouraged to drink extra fluids. Urine culture ordered.   Urethral discharge in male From 05/29/2018-see above note.    No Known Allergies   Current Outpatient Medications:  .  diazepam (VALIUM) 10 MG tablet, TAKE 1 TABLET BY MOUTH DAILY AS NEEDED, Disp: 30 tablet, Rfl: 0 .  pantoprazole (PROTONIX) 40 MG tablet, Take 1 tablet (40 mg total) by mouth daily., Disp: 30 tablet, Rfl: 3 .  sucralfate (CARAFATE) 1 g tablet, Take 1 tablet by mouth 2 (two) times daily as needed., Disp: , Rfl:  .  zolpidem (AMBIEN) 10 MG tablet, TAKE 1 TABLET BY MOUTH EVERY NIGHT AT BEDTIME, Disp: 30 tablet, Rfl: 0  Review of Systems  Constitutional: Negative for appetite change, chills and fever.  Respiratory: Negative for chest tightness, shortness of breath and wheezing.   Cardiovascular: Negative for chest pain and palpitations.  Gastrointestinal: Negative for abdominal pain, nausea and vomiting.    Social History   Tobacco Use  . Smoking status: Never Smoker  . Smokeless tobacco: Never Used  Substance Use Topics  . Alcohol use: No    Alcohol/week: 0.0 standard drinks      Objective:   BP (!) 98/58 (BP Location: Right Arm, Patient Position: Sitting, Cuff Size: Large)   Pulse 83   Temp 98.2 F (36.8 C) (Oral)   Resp 16   SpO2 98%  Vitals:   05/31/18 1045  BP: (!) 98/58  Pulse: 83  Resp: 16  Temp: 98.2 F (36.8 C)  TempSrc: Oral  SpO2: 98%   Physical Exam Constitutional:      General: He is not in acute distress.    Appearance: He is well-developed.  HENT:     Head: Normocephalic and atraumatic.     Right Ear: Hearing normal.     Left Ear: Hearing normal.     Nose: Nose normal.  Eyes:     General: Lids  are normal. No scleral icterus.       Right eye: No discharge.        Left eye: No discharge.     Conjunctiva/sclera: Conjunctivae normal.  Pulmonary:     Effort: Pulmonary effort is normal. No respiratory distress.  Musculoskeletal: Normal range of motion.  Skin:    Findings: No lesion or rash.  Neurological:     Mental Status: He is alert and oriented to person, place, and time.  Psychiatric:        Speech: Speech normal.        Behavior: Behavior normal.        Thought Content: Thought content normal.       Assessment & Plan    1. Urethral discharge in male Feeling much better without discharge today. Been on the Cipro for the past 2 days. He got a report from the [facility] he went to on 05-29-18 that the urine sample was inadequate for the GC/Chlamydia urine test ordered. Will get urethral swab today and may have to get HIV and RPR test if positive. - GC/Chlamydia Probe Amp([facility])  I,April Miller,acting as a scribe for [employer], PA.,have documented all relevant documentation on the behalf of [employer], PA,as  directed by  Dortha Kern, PA while in the presence of [employer], [place].   Dortha Kern, PA  [facility]

## 2018-06-02 LAB — GC/CHLAMYDIA PROBE AMP
CHLAMYDIA, DNA PROBE: NEGATIVE
Neisseria Gonorrhoeae by PCR: NEGATIVE

## 2018-06-04 ENCOUNTER — Telehealth: Payer: Self-pay

## 2018-06-04 NOTE — Telephone Encounter (Signed)
-----   Message from Tamsen Roers, [place] sent at 06/04/2018  8:02 AM EDT ----- Urethral swab negative. May have had enough antibiotic before swab was done and that is why this was negative. Finish all the antibiotic and be sure partner was checked.

## 2018-06-04 NOTE — Telephone Encounter (Signed)
Patient was advised. KW 

## 2018-06-11 ENCOUNTER — Encounter: Payer: Self-pay | Admitting: Family Medicine

## 2018-06-27 ENCOUNTER — Other Ambulatory Visit: Payer: Self-pay | Admitting: Family Medicine

## 2018-06-27 DIAGNOSIS — G4700 Insomnia, unspecified: Secondary | ICD-10-CM

## 2018-06-27 DIAGNOSIS — F4001 Agoraphobia with panic disorder: Secondary | ICD-10-CM

## 2018-07-11 ENCOUNTER — Encounter: Payer: Self-pay | Admitting: Physician Assistant

## 2018-07-11 ENCOUNTER — Encounter: Payer: Self-pay | Admitting: Family Medicine

## 2018-07-11 ENCOUNTER — Other Ambulatory Visit: Payer: Self-pay

## 2018-07-11 ENCOUNTER — Ambulatory Visit (INDEPENDENT_AMBULATORY_CARE_PROVIDER_SITE_OTHER): Payer: BLUE CROSS/BLUE SHIELD | Admitting: Physician Assistant

## 2018-07-11 VITALS — BP 99/68 | HR 101 | Temp 98.5°F | Resp 16 | Wt 173.6 lb

## 2018-07-11 DIAGNOSIS — K0889 Other specified disorders of teeth and supporting structures: Secondary | ICD-10-CM | POA: Diagnosis not present

## 2018-07-11 MED ORDER — TRAMADOL HCL 50 MG PO TABS: 50.0000 mg | ORAL_TABLET | Freq: Three times a day (TID) | ORAL | 0 refills | Status: AC

## 2018-07-11 NOTE — Patient Instructions (Signed)
Dental Pain  Dental pain may be caused by many things, including:  Tooth decay (cavities or caries).  Infection.  The inner part of the tooth being filled with pus (abscess).  Injury.  Sometimes the cause of pain is unknown.  Your pain can vary. It may be mild or severe. You may have it all the time, or it may occur only when you are:  Chewing.  Exposed to hot or cold temperature.  Eating or drinking sugary foods or beverages, such as soda or candy.  Follow these instructions at home:  Medicines  Take over-the-counter and prescription medicines only as told by your doctor.  If you were prescribed an antibiotic medicine, take it as told by your doctor. Do not stop taking the medicine even if you start to feel better.  Eating and drinking  Do not eat foods or drinks that cause you pain. These include:  Very hot or very cold foods or drinks.  Sweet or sugary foods or drinks.  Managing pain and swelling    Gargle with a salt-water mixture 3-4 times a day. To make this, dissolve -1 tsp of salt in 1 cup of warm water.  If told, put ice on the painful area of your face:  Put ice in a plastic bag.  Place a towel between your skin and the bag.  Leave the ice on for 20 minutes, 2-3 times a day.  Brushing your teeth  Brush your teeth twice a day using a fluoride toothpaste.  Floss your teeth once a day.  Use a toothpaste made for sensitive teeth as told by your doctor.  Use a soft toothbrush.  General instructions  Do not apply heat to the outside of your face.  Watch your dental pain. Let your doctor know if there are any changes.  Keep all follow-up visits as told by your doctor. This is important.  Contact a doctor if:  Your pain is not relieved by medicines.  You have new symptoms.  Your symptoms get worse.  Get help right away if:  You cannot open your mouth.  You are having trouble breathing or swallowing.  You have a fever.  Your face, neck, or jaw is swollen.  Summary  Dental pain may be caused by many things,  including tooth decay, injury, or infection. In some cases, the cause is not known.  Your pain may be mild or severe. You may have pain all the time, or you may have it only when you eat or drink.  Take over-the-counter and prescription medicines only as told by your doctor.  Watch your dental pain for any changes. Let your doctor know if symptoms get worse.  This information is not intended to replace advice given to you by your health care provider. Make sure you discuss any questions you have with your health care provider.  Document Released: 08/10/2007 Document Revised: 03/06/2017 Document Reviewed: 03/06/2017  Elsevier Interactive Patient Education  2019 Elsevier Inc.

## 2018-07-11 NOTE — Progress Notes (Signed)
Patient: Bobby ChafeJohn Alan [name] Male    DOB: 09/16/1985   33 y.o.   MRN: [id number] Visit Date: 07/11/2018  Today's Provider: Trey SailorsAdriana M Pollak, PA-C   Chief Complaint  Patient presents with  . Oral Pain   Subjective:   Reports he has a history of dental cavities, has had route canal prior with dentist. He has an appointment this Friday with his dentist. Reports very significant discomfort in left back tooth. Reports difficulty eating and chewing. Denies fevers, chills, pus, pain. He reports he takes valium as needed and ambien as needed, but not every day.   Oral Pain   This is a new problem. The current episode started yesterday. The problem occurs constantly. The problem has been gradually worsening. The pain is at a severity of 10/10. The pain is severe. Associated symptoms include facial pain and thermal sensitivity. Pertinent negatives include no difficulty swallowing, fever, oral bleeding or sinus pressure. He has tried acetaminophen and NSAIDs for the symptoms. The treatment provided no relief.    No Known Allergies   Current Outpatient Medications:  .  diazepam (VALIUM) 10 MG tablet, TAKE 1 TABLET BY MOUTH EVERY DAY AS NEEDED, Disp: 30 tablet, Rfl: 0 .  pantoprazole (PROTONIX) 40 MG tablet, Take 1 tablet (40 mg total) by mouth daily., Disp: 30 tablet, Rfl: 3 .  traMADol (ULTRAM) 50 MG tablet, Take 1 tablet (50 mg total) by mouth every 8 (eight) hours for 5 days., Disp: 15 tablet, Rfl: 0 .  zolpidem (AMBIEN) 10 MG tablet, TAKE 1 TABLET BY MOUTH EVERY NIGHT AT BEDTIME (Patient not taking: Reported on 07/11/2018), Disp: 30 tablet, Rfl: 0  Review of Systems  Constitutional: Negative for fever.  HENT: Negative for sinus pressure.     Social History   Tobacco Use  . Smoking status: Never Smoker  . Smokeless tobacco: Never Used  Substance Use Topics  . Alcohol use: No    Alcohol/week: 0.0 standard drinks      Objective:   BP 99/68   Pulse (!) 101   Temp 98.5 F (36.9 C)  (Oral)   Resp 16   Wt 173 lb 9.6 oz (78.7 kg)   BMI 24.91 kg/m  Vitals:   07/11/18 1342  BP: 99/68  Pulse: (!) 101  Resp: 16  Temp: 98.5 F (36.9 C)  TempSrc: Oral  Weight: 173 lb 9.6 oz (78.7 kg)     Physical Exam Constitutional:      Appearance: Normal appearance.  HENT:     Mouth/Throat:     Mouth: Mucous membranes are moist. No oral lesions.     Dentition: Dental caries present. No gingival swelling, dental abscesses or gum lesions.     Tongue: No lesions.     Palate: No mass.     Pharynx: Oropharynx is clear.     Tonsils: No tonsillar exudate.   Neurological:     Mental Status: He is alert.         Assessment & Plan    1. Pain, dental  He has multiple dental caries. No obvious abscess or infection. Likely needs dental intervention, counseled there may be little I can do outside of pain control. Will give short course of medication as below until he can see his dentist on Friday .  - traMADol (ULTRAM) 50 MG tablet; Take 1 tablet (50 mg total) by mouth every 8 (eight) hours for 5 days.  Dispense: 15 tablet; Refill: 0  The entirety of  the information documented in the History of Present Illness, Review of Systems and Physical Exam were personally obtained by me. Portions of this information were initially documented by Sheliah Hatch, CMA and reviewed by me for thoroughness and accuracy.   F/u PRN.      I,Adriana M Pollak,acting as a Neurosurgeon for Trey Sailors, PA-C.,have documented all relevant documentation on the behalf of Trey Sailors, PA-C,as directed by  Trey Sailors, PA-C while in the presence of Trey Sailors, PA-C. Trey Sailors, PA-C  [facility]

## 2018-07-29 ENCOUNTER — Other Ambulatory Visit: Payer: Self-pay | Admitting: Family Medicine

## 2018-07-29 DIAGNOSIS — F4001 Agoraphobia with panic disorder: Secondary | ICD-10-CM

## 2018-07-31 ENCOUNTER — Encounter: Payer: Self-pay | Admitting: Family Medicine

## 2018-07-31 ENCOUNTER — Other Ambulatory Visit: Payer: Self-pay | Admitting: Family Medicine

## 2018-07-31 DIAGNOSIS — K219 Gastro-esophageal reflux disease without esophagitis: Secondary | ICD-10-CM

## 2018-07-31 MED ORDER — PANTOPRAZOLE SODIUM 40 MG PO TBEC: 40.0000 mg | DELAYED_RELEASE_TABLET | Freq: Every day | ORAL | 3 refills | Status: DC

## 2018-08-22 ENCOUNTER — Encounter: Payer: Self-pay | Admitting: Family Medicine

## 2018-08-22 DIAGNOSIS — Z20828 Contact with and (suspected) exposure to other viral communicable diseases: Secondary | ICD-10-CM | POA: Diagnosis not present

## 2018-08-31 ENCOUNTER — Other Ambulatory Visit: Payer: Self-pay | Admitting: Family Medicine

## 2018-08-31 DIAGNOSIS — F4001 Agoraphobia with panic disorder: Secondary | ICD-10-CM

## 2018-08-31 DIAGNOSIS — G4700 Insomnia, unspecified: Secondary | ICD-10-CM

## 2018-09-03 ENCOUNTER — Ambulatory Visit: Payer: BC Managed Care – PPO | Admitting: Family Medicine

## 2018-09-03 ENCOUNTER — Other Ambulatory Visit: Payer: Self-pay

## 2018-09-03 ENCOUNTER — Encounter: Payer: Self-pay | Admitting: Family Medicine

## 2018-09-03 VITALS — BP 115/80 | HR 78 | Temp 98.6°F | Wt 169.2 lb

## 2018-09-03 DIAGNOSIS — F4001 Agoraphobia with panic disorder: Secondary | ICD-10-CM | POA: Diagnosis not present

## 2018-09-03 DIAGNOSIS — K30 Functional dyspepsia: Secondary | ICD-10-CM

## 2018-09-03 NOTE — Progress Notes (Signed)
Patient: Bobby [name] Male    DOB: May 10, 1985   33 y.o.   MRN: [id number] Visit Date: 09/03/2018  Today's Provider: Vernie Murders, PA   No chief complaint on file.  Subjective:    HPI Patient presents today to get FMLA paperwork filled out for his place of employment [employer]. Patient states that he is not allowed to miss no more then 3 days without having the paperwork filled out for any sick time. Still having anxiety with panic attacks disturbing sleep. Uses Diazepam 10 mg at 2 pm each day and uses Ambien 10 mg at midnight twice a week. This regimen seems to work well without daytime drowsiness  Past Medical History:  Diagnosis Date  . ADHD (attention deficit hyperactivity disorder)   . Anxiety   . SVT (supraventricular tachycardia) ([place])    resolved since ablation  . WPW (Wolff-Parkinson-White syndrome)    resolved since ablation   Past Surgical History:  Procedure Laterality Date  . CARDIAC CATHETERIZATION    . TONSILLECTOMY  1994  . TONSILLECTOMY     Family History  Problem Relation Age of Onset  . Breast cancer Mother   . Ovarian cancer Mother   . Alcohol abuse Father   . Drug abuse Father   . Heart attack Maternal Grandfather   . Heart attack Paternal Grandfather   . Depression Brother    No Known Allergies  Current Outpatient Medications:  .  diazepam (VALIUM) 10 MG tablet, TAKE 1 TABLET BY MOUTH EVERY DAY AS NEEDED, Disp: 30 tablet, Rfl: 0 .  pantoprazole (PROTONIX) 40 MG tablet, Take 1 tablet (40 mg total) by mouth daily., Disp: 30 tablet, Rfl: 3 .  zolpidem (AMBIEN) 10 MG tablet, TAKE 1 TABLET BY MOUTH EVERY NIGHT AT BEDTIME, Disp: 30 tablet, Rfl: 0  Review of Systems  Constitutional: Negative.   Respiratory: Negative.   Hematological: Negative.    Social History   Tobacco Use  . Smoking status: Never Smoker  . Smokeless tobacco: Never Used  Substance Use Topics  . Alcohol use: No    Alcohol/week: 0.0 standard drinks     Objective:    BP 115/80 (BP Location: Right Arm, Patient Position: Sitting, Cuff Size: Normal)   Pulse 78   Temp 98.6 F (37 C) (Oral)   Wt 169 lb 3.2 oz (76.7 kg)   SpO2 98%   BMI 24.28 kg/m    Wt Readings from Last 3 Encounters:  09/03/18 169 lb 3.2 oz (76.7 kg)  07/11/18 173 lb 9.6 oz (78.7 kg)  05/29/18 185 lb (83.9 kg)   Vitals:   09/03/18 1500  BP: 115/80  Pulse: 78  Temp: 98.6 F (37 C)  TempSrc: Oral  SpO2: 98%  Weight: 169 lb 3.2 oz (76.7 kg)   Physical Exam Vitals signs and nursing note reviewed.  Constitutional:      General: He is not in acute distress.    Appearance: He is well-developed.  HENT:     Head: Normocephalic and atraumatic.     Right Ear: Hearing normal.     Left Ear: Hearing normal.     Nose: Nose normal.  Eyes:     General: Lids are normal. No scleral icterus.       Right eye: No discharge.        Left eye: No discharge.     Conjunctiva/sclera: Conjunctivae normal.  Neck:     Musculoskeletal: Normal range of motion and neck supple.  Cardiovascular:  Rate and Rhythm: Normal rate and regular rhythm.     Heart sounds: Normal heart sounds.  Pulmonary:     Effort: Pulmonary effort is normal. No respiratory distress.  Abdominal:     General: Bowel sounds are normal.     Palpations: Abdomen is soft.  Musculoskeletal: Normal range of motion.  Skin:    Findings: No lesion or rash.  Neurological:     Mental Status: He is alert and oriented to person, place, and time.  Psychiatric:        Attention and Perception: Attention normal.        Mood and Affect: Mood is anxious.        Speech: Speech normal.        Behavior: Behavior is agitated.        Thought Content: Thought content is paranoid.        Cognition and Memory: Cognition normal.        Judgment: Judgment normal.       Assessment & Plan    1. Panic disorder with agoraphobia and severe panic attacks Continues to have panic attacks and episodes of agoraphobia intermittently requiring use  of Diazepam 10 mg once a day at 2:00 pm. Anxiety level worse at night and uses Ambien 10 mg 2 times a week to sleep from 12:30 am to 6:00 am. Has episode occasionally incapacitating him and he will stay home for 1-8 hours. He has to take an additional Diazepam if these episodes last more than a couple hours. With this chronic condition, we completed the FMLA paperwork for the next 6 months.  2. Acid indigestion When panic attacks occur, acid reflux will flare. Usually has good control using Pantoprozole 40 mg qd. Followed by Dr. Norma Fredricksonoledo (gastroenterologist) prn.     Dortha Kernennis Areen Trautner, PA  [facility]

## 2018-09-04 ENCOUNTER — Encounter: Payer: Self-pay | Admitting: Family Medicine

## 2018-10-01 ENCOUNTER — Other Ambulatory Visit: Payer: Self-pay | Admitting: Family Medicine

## 2018-10-01 DIAGNOSIS — F4001 Agoraphobia with panic disorder: Secondary | ICD-10-CM

## 2018-10-01 DIAGNOSIS — G4700 Insomnia, unspecified: Secondary | ICD-10-CM

## 2018-10-28 ENCOUNTER — Other Ambulatory Visit: Payer: Self-pay | Admitting: Family Medicine

## 2018-10-28 DIAGNOSIS — K219 Gastro-esophageal reflux disease without esophagitis: Secondary | ICD-10-CM

## 2018-11-01 ENCOUNTER — Other Ambulatory Visit: Payer: Self-pay | Admitting: Family Medicine

## 2018-11-01 DIAGNOSIS — F4001 Agoraphobia with panic disorder: Secondary | ICD-10-CM

## 2018-12-04 ENCOUNTER — Other Ambulatory Visit: Payer: Self-pay | Admitting: Family Medicine

## 2018-12-04 DIAGNOSIS — F4001 Agoraphobia with panic disorder: Secondary | ICD-10-CM

## 2019-01-04 ENCOUNTER — Other Ambulatory Visit: Payer: Self-pay | Admitting: Family Medicine

## 2019-01-04 DIAGNOSIS — F4001 Agoraphobia with panic disorder: Secondary | ICD-10-CM

## 2019-01-15 ENCOUNTER — Ambulatory Visit: Payer: BC Managed Care – PPO | Admitting: Physician Assistant

## 2019-01-15 NOTE — Progress Notes (Deleted)
   {  Method of visit:23308}  Patient: Bobby [name] Male    DOB: [birth year]   33 y.o.   MRN: [id number] Visit Date: 01/15/2019  Today's Provider: Trinna Post, PA-C   No chief complaint on file.  Subjective:     Back Pain      No Known Allergies   Current Outpatient Medications:  .  diazepam (VALIUM) 10 MG tablet, TAKE 1 TABLET BY MOUTH EVERY DAY AS NEEDED., Disp: 30 tablet, Rfl: 0 .  pantoprazole (PROTONIX) 40 MG tablet, TAKE 1 TABLET BY MOUTH EVERY DAY 30 MINUTES BEFORE A MEAL, Disp: 90 tablet, Rfl: 1 .  zolpidem (AMBIEN) 10 MG tablet, TAKE 1 TABLET BY MOUTH EVERY NIGHT AT BEDTIME, Disp: 30 tablet, Rfl: 0  Review of Systems  Constitutional: Negative.   Respiratory: Negative.   Cardiovascular: Negative.   Musculoskeletal: Positive for back pain.    Social History   Tobacco Use  . Smoking status: Never Smoker  . Smokeless tobacco: Never Used  Substance Use Topics  . Alcohol use: No    Alcohol/week: 0.0 standard drinks      Objective:   There were no vitals taken for this visit. There were no vitals filed for this visit.There is no height or weight on file to calculate BMI.   Physical Exam   No results found for any visits on 01/15/19.     Assessment & [place], PA-C  [facility]

## 2019-02-05 ENCOUNTER — Other Ambulatory Visit: Payer: Self-pay | Admitting: Family Medicine

## 2019-02-05 DIAGNOSIS — F4001 Agoraphobia with panic disorder: Secondary | ICD-10-CM

## 2019-03-07 ENCOUNTER — Other Ambulatory Visit: Payer: Self-pay | Admitting: Family Medicine

## 2019-03-07 DIAGNOSIS — F4001 Agoraphobia with panic disorder: Secondary | ICD-10-CM

## 2019-03-07 NOTE — Telephone Encounter (Signed)
Requested medication (s) are due for refill today: yes  Requested medication (s) are on the active medication list: yes  Last refill:  02/05/2019  Future visit scheduled: yes  Notes to clinic:  This refill cannot be delegated    Requested Prescriptions  Pending Prescriptions Disp Refills   diazepam (VALIUM) 10 MG tablet [Pharmacy Med Name: DIAZEPAM 10MG  TABLETS] 30 tablet     Sig: TAKE 1 TABLET BY MOUTH EVERY DAY AS NEEDED      Not Delegated - Psychiatry:  Anxiolytics/Hypnotics Failed - 03/07/2019  8:23 AM      Failed - This refill cannot be delegated      Failed - Urine Drug Screen completed in last 360 days.      Failed - Valid encounter within last 6 months    Recent Outpatient Visits           6 months ago Panic disorder with agoraphobia and severe panic attacks   [place], [place]   7 months ago Pain, dental   [place], [place], [place]   9 months ago Urethral discharge in male   [place], [place], [place]   9 months ago [place], [place], [place]   12 months ago Panic disorder with agoraphobia   [place], [place]       Future Appointments             In 5 days [place], [place], [place], [place]

## 2019-03-07 NOTE — Telephone Encounter (Signed)
Please review for Dennis.    Thanks,   -Lyndel Sarate  

## 2019-03-12 ENCOUNTER — Ambulatory Visit: Payer: BC Managed Care – PPO | Admitting: Family Medicine

## 2019-03-15 ENCOUNTER — Ambulatory Visit: Payer: BC Managed Care – PPO | Admitting: Physician Assistant

## 2019-03-18 ENCOUNTER — Encounter: Payer: Self-pay | Admitting: Physician Assistant

## 2019-03-18 ENCOUNTER — Ambulatory Visit (INDEPENDENT_AMBULATORY_CARE_PROVIDER_SITE_OTHER): Payer: BC Managed Care – PPO | Admitting: Physician Assistant

## 2019-03-18 ENCOUNTER — Other Ambulatory Visit: Payer: Self-pay

## 2019-03-18 VITALS — BP 115/82 | HR 88 | Temp 97.3°F | Resp 16 | Wt 186.0 lb

## 2019-03-18 DIAGNOSIS — F411 Generalized anxiety disorder: Secondary | ICD-10-CM

## 2019-03-18 NOTE — Progress Notes (Signed)
       Patient: Bobby [name] Male    DOB: 1985/06/04   34 y.o.   MRN: [id number] Visit Date: 03/18/2019  Today's Provider: Margaretann Loveless, PA-C   Chief Complaint  Patient presents with  . Follow-up    Panic Disorder with Agoraphobia and FMLA paper work.   Subjective:     HPI  Panic Disorder with Agoraphobia:Patient here today requesting renewal on his FMLA forms. Forms are to done every 6 months. Patient currently stable on Valium 10 mg 1/2 tablet 1-2 times a day as needed.  No Known Allergies   Current Outpatient Medications:  .  diazepam (VALIUM) 10 MG tablet, TAKE 1 TABLET BY MOUTH EVERY DAY AS NEEDED, Disp: 30 tablet, Rfl: 0 .  pantoprazole (PROTONIX) 40 MG tablet, TAKE 1 TABLET BY MOUTH EVERY DAY 30 MINUTES BEFORE A MEAL, Disp: 90 tablet, Rfl: 1 .  zolpidem (AMBIEN) 10 MG tablet, TAKE 1 TABLET BY MOUTH EVERY NIGHT AT BEDTIME (Patient not taking: Reported on 03/18/2019), Disp: 30 tablet, Rfl: 0  Review of Systems  Constitutional: Negative.   Respiratory: Negative.   Cardiovascular: Negative.   Neurological: Negative.     Social History   Tobacco Use  . Smoking status: Never Smoker  . Smokeless tobacco: Never Used  Substance Use Topics  . Alcohol use: No    Alcohol/week: 0.0 standard drinks      Objective:   BP 115/82 (BP Location: Left Arm, Patient Position: Sitting, Cuff Size: Large)   Pulse 88   Temp (!) 97.3 F (36.3 C) (Temporal)   Resp 16   Wt 186 lb (84.4 kg)   BMI 26.69 kg/m  Vitals:   03/18/19 1336  BP: 115/82  Pulse: 88  Resp: 16  Temp: (!) 97.3 F (36.3 C)  TempSrc: Temporal  Weight: 186 lb (84.4 kg)  Body mass index is 26.69 kg/m.   Physical Exam Vitals reviewed.  Constitutional:      General: He is not in acute distress.    Appearance: Normal appearance. He is well-developed.  HENT:     Head: Normocephalic and atraumatic.  Eyes:     General: No scleral icterus.    Conjunctiva/sclera: Conjunctivae normal.  Pulmonary:       Effort: Pulmonary effort is normal. No respiratory distress.  Musculoskeletal:     Cervical back: Normal range of motion and neck supple.  Neurological:     Mental Status: He is alert.  Psychiatric:        Mood and Affect: Mood normal.        Behavior: Behavior normal.        Thought Content: Thought content normal.        Judgment: Judgment normal.      No results found for any visits on 03/18/19.     Assessment & Plan    1. Anxiety, generalized Stable. FMLA forms completed.     Margaretann Loveless, PA-C  [facility]

## 2019-04-17 ENCOUNTER — Other Ambulatory Visit: Payer: Self-pay | Admitting: Family Medicine

## 2019-04-17 DIAGNOSIS — F4001 Agoraphobia with panic disorder: Secondary | ICD-10-CM

## 2019-04-25 ENCOUNTER — Other Ambulatory Visit: Payer: Self-pay | Admitting: Family Medicine

## 2019-04-25 DIAGNOSIS — K219 Gastro-esophageal reflux disease without esophagitis: Secondary | ICD-10-CM

## 2019-05-17 ENCOUNTER — Encounter: Payer: Self-pay | Admitting: Family Medicine

## 2019-05-17 ENCOUNTER — Other Ambulatory Visit: Payer: Self-pay | Admitting: Family Medicine

## 2019-05-17 DIAGNOSIS — F4001 Agoraphobia with panic disorder: Secondary | ICD-10-CM

## 2019-05-17 DIAGNOSIS — J301 Allergic rhinitis due to pollen: Secondary | ICD-10-CM

## 2019-05-17 MED ORDER — FLUTICASONE PROPIONATE 50 MCG/ACT NA SUSP: 2.0000 | Freq: Every day | NASAL | 6 refills | Status: DC

## 2019-05-17 MED ORDER — LORATADINE 10 MG PO TABS: 10.0000 mg | ORAL_TABLET | Freq: Every day | ORAL | 11 refills | Status: DC

## 2019-05-17 NOTE — Telephone Encounter (Signed)
Requested medication (s) are due for refill today: yes  Requested medication (s) are on the active medication list: yes  Last refill:  04/19/19  Future visit scheduled: no  Notes to clinic:  not delegated    Requested Prescriptions  Pending Prescriptions Disp Refills   diazepam (VALIUM) 10 MG tablet [Pharmacy Med Name: DIAZEPAM 10MG  TABLETS] 30 tablet     Sig: TAKE 1 TABLET BY MOUTH EVERY DAY AS NEEDED      Not Delegated - Psychiatry:  Anxiolytics/Hypnotics Failed - 05/17/2019  8:16 AM      Failed - This refill cannot be delegated      Failed - Urine Drug Screen completed in last 360 days.      Passed - Valid encounter within last 6 months    Recent Outpatient Visits           2 months ago Anxiety, generalized   [facility] [place], [place], [place]   8 months ago Panic disorder with agoraphobia and severe panic attacks   [facility] [place], [facility], [place]   10 months ago Pain, dental   [facility] [place], M   11 months ago Urethral discharge in male   [facility] [place], [facility], [place] Cipro   11 months ago Dysuria   [place], [place], [place]

## 2019-05-18 DIAGNOSIS — Z20828 Contact with and (suspected) exposure to other viral communicable diseases: Secondary | ICD-10-CM | POA: Diagnosis not present

## 2019-05-20 DIAGNOSIS — Z20828 Contact with and (suspected) exposure to other viral communicable diseases: Secondary | ICD-10-CM | POA: Diagnosis not present

## 2019-06-28 ENCOUNTER — Other Ambulatory Visit: Payer: Self-pay | Admitting: Family Medicine

## 2019-06-28 DIAGNOSIS — F4001 Agoraphobia with panic disorder: Secondary | ICD-10-CM

## 2019-06-28 NOTE — Telephone Encounter (Signed)
Requested medication (s) are due for refill today: yes  Requested medication (s) are on the active medication list: yes  Last refill:  05/17/2019  Future visit scheduled: no  Notes to clinic:  this refill cannot be delegated    Requested Prescriptions  Pending Prescriptions Disp Refills   diazepam (VALIUM) 10 MG tablet [Pharmacy Med Name: DIAZEPAM 10MG  TABLETS] 30 tablet     Sig: TAKE 1 TABLET BY MOUTH EVERY DAY AS NEEDED      Not Delegated - Psychiatry:  Anxiolytics/Hypnotics Failed - 06/28/2019  9:10 AM      Failed - This refill cannot be delegated      Failed - Urine Drug Screen completed in last 360 days.      Passed - Valid encounter within last 6 months    Recent Outpatient Visits           3 months ago Anxiety, generalized   [facility] [place], [place], [place]   9 months ago Panic disorder with agoraphobia and severe panic attacks   [facility] [place], [facility], [place] Cipro   11 months ago Pain, dental   [facility] [place], [place], [place]   1 year ago Urethral discharge in male   [facility] [place], [facility], [place]   1 year ago Dysuria   [facility] [place], [facility], [place]

## 2019-08-22 ENCOUNTER — Other Ambulatory Visit: Payer: Self-pay | Admitting: Family Medicine

## 2019-08-22 DIAGNOSIS — F4001 Agoraphobia with panic disorder: Secondary | ICD-10-CM

## 2019-08-26 ENCOUNTER — Telehealth: Payer: Self-pay | Admitting: Family Medicine

## 2019-08-26 NOTE — Telephone Encounter (Signed)
Patient is calling to notify Bobby [name] that he will be dropping off his FMLA paper work for an extension. Patient states he has to drop off the paper work every 46months for an extension. Patient would like to be called when the paper work is completed. And he will pick up. Please advise CB- [phone]

## 2019-08-26 NOTE — Telephone Encounter (Signed)
FYI

## 2019-09-02 ENCOUNTER — Encounter: Payer: Self-pay | Admitting: Family Medicine

## 2019-09-02 NOTE — Telephone Encounter (Signed)
Paperwork completed; pt advised.  Thanks,   -Vernona Rieger

## 2019-09-02 NOTE — Telephone Encounter (Signed)
Patient is calling to check the status of his FMLA paper work. Patient dropped off the paper work on 08/26/19. Patient states the paper work is due today. Also requesting if completed. Could the paperwork be faxed to the fax number on the form.  Patient is a callback with an update. Please advise CB- [phone]

## 2019-10-18 ENCOUNTER — Other Ambulatory Visit: Payer: Self-pay | Admitting: Family Medicine

## 2019-10-18 DIAGNOSIS — F4001 Agoraphobia with panic disorder: Secondary | ICD-10-CM

## 2019-10-18 NOTE — Telephone Encounter (Signed)
Requested medication (s) are due for refill today: yes  Requested medication (s) are on the active medication list: yes  Last refill:  08/22/19 #30  Future visit scheduled: no  Notes to clinic:  Please review for refill. Refill not delegated per protocol    Requested Prescriptions  Pending Prescriptions Disp Refills   diazepam (VALIUM) 10 MG tablet [Pharmacy Med Name: DIAZEPAM 10MG  TABLETS] 30 tablet     Sig: TAKE 1 TABLET BY MOUTH EVERY DAY AS NEEDED      Not Delegated - Psychiatry:  Anxiolytics/Hypnotics Failed - 10/18/2019 11:01 AM      Failed - This refill cannot be delegated      Failed - Urine Drug Screen completed in last 360 days.      Failed - Valid encounter within last 6 months    Recent Outpatient Visits           7 months ago Anxiety, generalized   [facility] [place], [place], [place]   1 year ago Panic disorder with agoraphobia and severe panic attacks   [facility] [place], [facility], [place]   1 year ago Pain, dental   [facility], PA-C   1 year ago Urethral discharge in male   [facility] [place], [facility], [place] Cipro   1 year ago Dysuria   [facility] [place], [facility], [place]

## 2019-10-18 NOTE — Telephone Encounter (Signed)
LOV 03/28/19  LRF 08/22/19 # 30 no RF

## 2019-10-24 ENCOUNTER — Other Ambulatory Visit: Payer: Self-pay | Admitting: Family Medicine

## 2019-10-24 DIAGNOSIS — K219 Gastro-esophageal reflux disease without esophagitis: Secondary | ICD-10-CM

## 2019-10-24 NOTE — Telephone Encounter (Signed)
Requested Prescriptions  Pending Prescriptions Disp Refills  . pantoprazole (PROTONIX) 40 MG tablet [Pharmacy Med Name: PANTOPRAZOLE 40MG  TABLETS] 90 tablet 1    Sig: TAKE 1 TABLET BY MOUTH EVERY DAY 30 MINUTES BEFORE A MEAL     Gastroenterology: Proton Pump Inhibitors Passed - 10/24/2019  3:39 AM      Passed - Valid encounter within last 12 months    Recent Outpatient Visits          7 months ago Anxiety, generalized   [facility] [place], [place], [place]   1 year ago Panic disorder with agoraphobia and severe panic attacks   [facility] [place], [facility], [place]   1 year ago Pain, dental   [facility] [place], [place], [place]   1 year ago Urethral discharge in male   [facility] [place], [facility], [place] Cipro   1 year ago Dysuria   [facility] [place], [facility], [place] Cipro

## 2019-12-16 ENCOUNTER — Other Ambulatory Visit: Payer: Self-pay | Admitting: Physician Assistant

## 2019-12-16 DIAGNOSIS — F4001 Agoraphobia with panic disorder: Secondary | ICD-10-CM

## 2019-12-16 NOTE — Telephone Encounter (Signed)
Requested medication (s) are due for refill today: yes  Requested medication (s) are on the active medication list: yes  Last refill: 10/18/19  #30  0 refills  Future visit scheduled: no  Notes to clinic:  Not Delegated    Requested Prescriptions  Pending Prescriptions Disp Refills   diazepam (VALIUM) 10 MG tablet [Pharmacy Med Name: DIAZEPAM 10MG  TABLETS] 30 tablet     Sig: TAKE 1 TABLET BY MOUTH EVERY DAY AS NEEDED      Not Delegated - Psychiatry:  Anxiolytics/Hypnotics Failed - 12/16/2019 11:53 AM      Failed - This refill cannot be delegated      Failed - Urine Drug Screen completed in last 360 days.      Failed - Valid encounter within last 6 months    Recent Outpatient Visits           9 months ago Anxiety, generalized   [facility] [place], [place], [place]   1 year ago Panic disorder with agoraphobia and severe panic attacks   [facility] [place], [facility], [place] C[place]   1 year ago Pain, dental   [facility], PA-C   1 year ago Urethral discharge in male   [facility] [place], [facility], [place] Cipro   1 year ago Dysuria   [facility] [place], [facility], [place] C[place]

## 2020-01-27 ENCOUNTER — Other Ambulatory Visit: Payer: Self-pay | Admitting: Family Medicine

## 2020-01-27 DIAGNOSIS — F4001 Agoraphobia with panic disorder: Secondary | ICD-10-CM

## 2020-01-27 NOTE — Telephone Encounter (Signed)
Requested medication (s) are due for refill today: yes  Requested medication (s) are on the active medication list: yes  Last refill:  12/16/19 #30 0 refills  Future visit scheduled: no  Notes to clinic:  not delegated per protocol     Requested Prescriptions  Pending Prescriptions Disp Refills   diazepam (VALIUM) 10 MG tablet [Pharmacy Med Name: DIAZEPAM 10MG  TABLETS] 30 tablet     Sig: TAKE 1 TABLET BY MOUTH EVERY DAY AS NEEDED      Not Delegated - Psychiatry:  Anxiolytics/Hypnotics Failed - 01/27/2020 12:19 PM      Failed - This refill cannot be delegated      Failed - Urine Drug Screen completed in last 360 days      Failed - Valid encounter within last 6 months    Recent Outpatient Visits           10 months ago Anxiety, generalized   [facility] [place], [place], [place]   1 year ago Panic disorder with agoraphobia and severe panic attacks   [place], [employer], [place]   1 year ago Pain, dental   [facility] [place], [place], [place]   1 year ago Urethral discharge in male   [facility] [place], [facility], [place] C[place]   1 year ago Dysuria   [facility] [place], [place], [place]

## 2020-03-03 ENCOUNTER — Other Ambulatory Visit: Payer: Self-pay | Admitting: Family Medicine

## 2020-03-03 DIAGNOSIS — F4001 Agoraphobia with panic disorder: Secondary | ICD-10-CM

## 2020-03-03 NOTE — Telephone Encounter (Signed)
Requested medication (s) are due for refill today: yes  Requested medication (s) are on the active medication list:  yes  Last refill:  01/27/2020  Future visit scheduled: no  Notes to clinic: This refill cannot be delegated    Requested Prescriptions  Pending Prescriptions Disp Refills   diazepam (VALIUM) 10 MG tablet [Pharmacy Med Name: DIAZEPAM 10MG  TABLETS] 30 tablet     Sig: TAKE 1 TABLET BY MOUTH EVERY DAY AS NEEDED      Not Delegated - Psychiatry:  Anxiolytics/Hypnotics Failed - 03/03/2020 10:22 AM      Failed - This refill cannot be delegated      Failed - Urine Drug Screen completed in last 360 days      Failed - Valid encounter within last 6 months    Recent Outpatient Visits           11 months ago Anxiety, generalized   [facility] [place], [place], [place]   1 year ago Panic disorder with agoraphobia and severe panic attacks   [place], [employer], PA-C   1 year ago Pain, dental   [facility] [place], [place], [place]   1 year ago Urethral discharge in male   [facility] [place], [facility], PA-C   1 year ago Dysuria   [place] Cipro, [employer], [place] Cipro

## 2020-03-09 ENCOUNTER — Encounter: Payer: Self-pay | Admitting: Family Medicine

## 2020-03-10 DIAGNOSIS — Z20822 Contact with and (suspected) exposure to covid-19: Secondary | ICD-10-CM | POA: Diagnosis not present

## 2020-03-11 ENCOUNTER — Other Ambulatory Visit: Payer: Self-pay | Admitting: Family Medicine

## 2020-03-11 DIAGNOSIS — F4001 Agoraphobia with panic disorder: Secondary | ICD-10-CM

## 2020-03-11 MED ORDER — DIAZEPAM 10 MG PO TABS: 10.0000 mg | ORAL_TABLET | Freq: Every day | ORAL | 0 refills | Status: DC | PRN

## 2020-03-13 ENCOUNTER — Ambulatory Visit: Payer: BC Managed Care – PPO | Admitting: Family Medicine

## 2020-03-16 ENCOUNTER — Ambulatory Visit (INDEPENDENT_AMBULATORY_CARE_PROVIDER_SITE_OTHER): Payer: BC Managed Care – PPO | Admitting: Family Medicine

## 2020-03-16 ENCOUNTER — Encounter: Payer: Self-pay | Admitting: Family Medicine

## 2020-03-16 ENCOUNTER — Other Ambulatory Visit: Payer: Self-pay

## 2020-03-16 VITALS — BP 106/73 | HR 80 | Temp 97.3°F | Wt 187.0 lb

## 2020-03-16 DIAGNOSIS — Z0000 Encounter for general adult medical examination without abnormal findings: Secondary | ICD-10-CM

## 2020-03-16 DIAGNOSIS — F4001 Agoraphobia with panic disorder: Secondary | ICD-10-CM | POA: Diagnosis not present

## 2020-03-16 DIAGNOSIS — K219 Gastro-esophageal reflux disease without esophagitis: Secondary | ICD-10-CM

## 2020-03-16 DIAGNOSIS — E7800 Pure hypercholesterolemia, unspecified: Secondary | ICD-10-CM | POA: Diagnosis not present

## 2020-03-16 NOTE — Progress Notes (Signed)
Complete physical exam   Patient: Bobby [name]   DOB: Feb 09, 1986   35 y.o. Male  MRN: [id number] Visit Date: 03/16/2020  Today's healthcare provider: Vernie Murders, PA-C   No chief complaint on file.  Subjective    Bobby [name] is a 35 y.o. male who presents today for a complete physical exam.  He reports consuming a general diet.  He generally feels well. He reports sleeping fairly well. He does not have additional problems to discuss today.    Past Medical History:  Diagnosis Date  . ADHD (attention deficit hyperactivity disorder)   . Anxiety   . SVT (supraventricular tachycardia) ([place])    resolved since ablation  . WPW (Wolff-Parkinson-White syndrome)    resolved since ablation   Past Surgical History:  Procedure Laterality Date  . CARDIAC CATHETERIZATION    . TONSILLECTOMY  1994  . TONSILLECTOMY     Social History   Socioeconomic History  . Marital status: Single    Spouse name: Not on file  . Number of children: Not on file  . Years of education: Not on file  . Highest education level: Not on file  Occupational History  . Not on file  Tobacco Use  . Smoking status: Never Smoker  . Smokeless tobacco: Never Used  Vaping Use  . Vaping Use: Never used  Substance and Sexual Activity  . Alcohol use: No    Alcohol/week: 0.0 standard drinks  . Drug use: No  . Sexual activity: Yes    Birth control/protection: Condom  Other Topics Concern  . Not on file  Social History Narrative  . Not on file   Social Determinants of Health   Financial Resource Strain: Not on file  Food Insecurity: Not on file  Transportation Needs: Not on file  Physical Activity: Not on file  Stress: Not on file  Social Connections: Not on file  Intimate Partner Violence: Not on file   Family Status  Relation Name Status  . Mother  Alive  . Father  Alive  . MGF  Deceased  . PGF  Deceased  . Sister  Alive  . Brother  Alive  . Mat Aunt  Deceased  . Sister  Alive   . Sister  Alive  . Sister  Alive  . Brother  Alive  . Brother  Alive  . Brother  Alive  . Brother  Alive  . Brother  Deceased   Family History  Problem Relation Age of Onset  . Breast cancer Mother   . Ovarian cancer Mother   . Alcohol abuse Father   . Drug abuse Father   . Heart attack Maternal Grandfather   . Heart attack Paternal Grandfather   . Depression Brother    No Known Allergies   Patient Care Team: Beyza Bellino, Vickki Muff, PA-C as PCP - General (Physician Assistant)   Medications: Outpatient Medications Prior to Visit  Medication Sig  . diazepam (VALIUM) 10 MG tablet Take 1 tablet (10 mg total) by mouth daily as needed.  . pantoprazole (PROTONIX) 40 MG tablet TAKE 1 TABLET BY MOUTH EVERY DAY 30 MINUTES BEFORE A MEAL  . zolpidem (AMBIEN) 10 MG tablet TAKE 1 TABLET BY MOUTH EVERY NIGHT AT BEDTIME  . fluticasone (FLONASE) 50 MCG/ACT nasal spray Place 2 sprays into both nostrils daily. (Patient not taking: Reported on 03/16/2020)  . loratadine (CLARITIN) 10 MG tablet Take 1 tablet (10 mg total) by mouth daily. (Patient not taking: Reported on 03/16/2020)  No facility-administered medications prior to visit.    Review of Systems  Constitutional: Negative.   HENT: Negative.   Eyes: Negative.   Respiratory: Negative.   Cardiovascular: Negative.   Gastrointestinal: Negative.   Endocrine: Negative.   Genitourinary: Negative.   Musculoskeletal: Negative.   Skin: Negative.   Allergic/Immunologic: Negative.   Neurological: Negative.   Hematological: Negative.   Psychiatric/Behavioral: The patient is nervous/anxious.       Objective    BP 106/73 (BP Location: Right Arm, Patient Position: Sitting, Cuff Size: Normal)   Pulse 80   Temp (!) 97.3 F (36.3 C) (Oral)   Wt 187 lb (84.8 kg)   SpO2 100%   BMI 26.83 kg/m     Physical Exam Constitutional:      Appearance: Normal appearance. He is normal weight.  HENT:     Head: Normocephalic and atraumatic.      Right Ear: Tympanic membrane, ear canal and external ear normal.     Left Ear: Tympanic membrane, ear canal and external ear normal.     Nose: Nose normal.     Mouth/Throat:     Mouth: Mucous membranes are moist.     Pharynx: Oropharynx is clear.  Eyes:     Extraocular Movements: Extraocular movements intact.     Conjunctiva/sclera: Conjunctivae normal.     Pupils: Pupils are equal, round, and reactive to light.  Cardiovascular:     Rate and Rhythm: Normal rate and regular rhythm.     Pulses: Normal pulses.     Heart sounds: Normal heart sounds.  Pulmonary:     Effort: Pulmonary effort is normal.     Breath sounds: Normal breath sounds.  Abdominal:     General: Abdomen is flat. Bowel sounds are normal.     Palpations: Abdomen is soft.  Musculoskeletal:        General: Normal range of motion.     Cervical back: Normal range of motion and neck supple.  Skin:    General: Skin is warm and dry.  Neurological:     General: No focal deficit present.     Mental Status: He is alert and oriented to person, place, and time. Mental status is at baseline.  Psychiatric:        Mood and Affect: Mood normal.        Behavior: Behavior normal.        Thought Content: Thought content normal.        Judgment: Judgment normal.     Last depression screening scores PHQ 2/9 Scores 03/16/2020 03/18/2019 03/03/2017  PHQ - 2 Score 1 0 2  PHQ- 9 Score 8 - 13   Last fall risk screening Fall Risk  03/16/2020  Falls in the past year? 0  Number falls in past yr: 0  Injury with Fall? 0   Last Audit-C alcohol use screening Alcohol Use Disorder Test (AUDIT) 03/16/2020  1. How often do you have a drink containing alcohol? 1  2. How many drinks containing alcohol do you have on a typical day when you are drinking? 0  3. How often do you have six or more drinks on one occasion? 0  AUDIT-C Score 1  Alcohol Brief Interventions/Follow-up AUDIT Score <7 follow-up not indicated   A score of 3 or more in  women, and 4 or more in men indicates increased risk for alcohol abuse, EXCEPT if all of the points are from question 1   No results found for any visits on  03/16/20.  Assessment & Plan    Routine Health Maintenance and Physical Exam  Exercise Activities and Dietary recommendations Goals   Recommend exercise for 3-=4- minutest 3-4 days a week with a low fat balanced diet.     Immunization History  Administered Date(s) Administered  . DTaP 09/16/1987, 11/20/1987, 01/21/1988, 11/17/1988, 07/07/1992  . Hepatitis B 12/14/1998, 01/18/1999, 06/14/1999  . HiB (PRP-OMP) 01/17/1989  . IPV 09/16/1987, 11/20/1987, 11/17/1988, 07/07/1992  . MMR 11/17/1988, 07/07/1992    Health Maintenance  Topic Date Due  . TETANUS/TDAP  Never done  . INFLUENZA VACCINE  Never done  . Hepatitis C Screening  Completed  . HIV Screening  Completed    Discussed health benefits of physical activity, and encouraged him to engage in regular exercise appropriate for his age and condition.  1. Annual physical exam General health stable. Panic/anxiety to consider ANY vaccinations. Refuses COVID, Influenza or Tetanus immunizations. Recheck labs and given anticipatory counseling. - CBC with Differential/Platelet - Comprehensive metabolic panel - Lipid Panel With LDL/HDL Ratio - TSH  2. Gastroesophageal reflux disease without esophagitis Well controlled by Protonix daily. Advised to restrict caffeine and acidic foods. Recheck labs. - CBC with Differential/Platelet - Comprehensive metabolic panel - Lipid Panel With LDL/HDL Ratio  3. Panic disorder with agoraphobia Continues to have difficulty in crowds and panic if he doesn't take 1/2 tablet of Valium 10 mg during the afternoon and the other 1/2 at bedtime. Recheck labs. - CBC with Differential/Platelet - Comprehensive metabolic panel - TSH   No follow-ups on file.     I, Larance Ratledge, PA-C, have reviewed all documentation for this visit. The  documentation on 03/16/20 for the exam, diagnosis, procedures, and orders are all accurate and complete.    Vernie Murders, PA-C  [employer] [phone] (phone) [phone] (fax)  [place]

## 2020-03-17 LAB — COMPREHENSIVE METABOLIC PANEL
ALT: 47 IU/L — ABNORMAL HIGH (ref 0–44)
AST: 29 IU/L (ref 0–40)
Albumin/Globulin Ratio: 2 (ref 1.2–2.2)
Albumin: 4.9 g/dL (ref 4.0–5.0)
Alkaline Phosphatase: 98 IU/L (ref 44–121)
BUN/Creatinine Ratio: 11 (ref 9–20)
BUN: 11 mg/dL (ref 6–20)
Bilirubin Total: 1.2 mg/dL (ref 0.0–1.2)
CO2: 26 mmol/L (ref 20–29)
Calcium: 10 mg/dL (ref 8.7–10.2)
Chloride: 104 mmol/L (ref 96–106)
Creatinine, Ser: 1.02 mg/dL (ref 0.76–1.27)
GFR calc Af Amer: 112 mL/min/{1.73_m2} (ref >59)
GFR calc non Af Amer: 97 mL/min/{1.73_m2} (ref >59)
Globulin, Total: 2.4 g/dL (ref 1.5–4.5)
Glucose: 81 mg/dL (ref 65–99)
Potassium: 4.5 mmol/L (ref 3.5–5.2)
Sodium: 142 mmol/L (ref 134–144)
Total Protein: 7.3 g/dL (ref 6.0–8.5)

## 2020-03-17 LAB — CBC WITH DIFFERENTIAL/PLATELET
Basophils Absolute: 0.1 10*3/uL (ref 0.0–0.2)
Basos: 1 %
EOS (ABSOLUTE): 0.2 10*3/uL (ref 0.0–0.4)
Eos: 2 %
Hematocrit: 45.5 % (ref 37.5–51.0)
Hemoglobin: 16.1 g/dL (ref 13.0–17.7)
Immature Grans (Abs): 0.2 10*3/uL — ABNORMAL HIGH (ref 0.0–0.1)
Immature Granulocytes: 2 %
Lymphocytes Absolute: 2.8 10*3/uL (ref 0.7–3.1)
Lymphs: 32 %
MCH: 31.2 pg (ref 26.6–33.0)
MCHC: 35.4 g/dL (ref 31.5–35.7)
MCV: 88 fL (ref 79–97)
Monocytes Absolute: 0.6 10*3/uL (ref 0.1–0.9)
Monocytes: 7 %
Neutrophils Absolute: 4.8 10*3/uL (ref 1.4–7.0)
Neutrophils: 56 %
Platelets: 340 10*3/uL (ref 150–450)
RBC: 5.16 x10E6/uL (ref 4.14–5.80)
RDW: 12 % (ref 11.6–15.4)
WBC: 8.6 10*3/uL (ref 3.4–10.8)

## 2020-03-17 LAB — LIPID PANEL WITH LDL/HDL RATIO
Cholesterol, Total: 177 mg/dL (ref 100–199)
HDL: 26 mg/dL — ABNORMAL LOW (ref >39)
LDL Chol Calc (NIH): 121 mg/dL — ABNORMAL HIGH (ref 0–99)
LDL/HDL Ratio: 4.7 ratio — ABNORMAL HIGH (ref 0.0–3.6)
Triglycerides: 167 mg/dL — ABNORMAL HIGH (ref 0–149)
VLDL Cholesterol Cal: 30 mg/dL (ref 5–40)

## 2020-03-17 LAB — TSH: TSH: 2.18 u[IU]/mL (ref 0.450–4.500)

## 2020-03-20 ENCOUNTER — Encounter: Payer: Self-pay | Admitting: Family Medicine

## 2020-03-23 ENCOUNTER — Other Ambulatory Visit: Payer: Self-pay | Admitting: Family Medicine

## 2020-03-23 DIAGNOSIS — F4001 Agoraphobia with panic disorder: Secondary | ICD-10-CM

## 2020-03-23 MED ORDER — DIAZEPAM 10 MG PO TABS: 10.0000 mg | ORAL_TABLET | Freq: Every day | ORAL | 0 refills | Status: DC | PRN

## 2020-04-29 ENCOUNTER — Other Ambulatory Visit: Payer: Self-pay | Admitting: Family Medicine

## 2020-04-29 DIAGNOSIS — F4001 Agoraphobia with panic disorder: Secondary | ICD-10-CM

## 2020-04-29 NOTE — Telephone Encounter (Signed)
Requested medication (s) are due for refill today: Yes  Requested medication (s) are on the active medication list: Yes  Last refill:  03/23/20  Future visit scheduled: No  Notes to clinic:  Unable to refill per protocol, cannot delegate.      Requested Prescriptions  Pending Prescriptions Disp Refills   diazepam (VALIUM) 10 MG tablet [Pharmacy Med Name: DIAZEPAM 10MG  TABLETS] 30 tablet     Sig: TAKE 1 TABLET(10 MG) BY MOUTH DAILY AS NEEDED      Not Delegated - Psychiatry:  Anxiolytics/Hypnotics Failed - 04/29/2020 11:12 AM      Failed - This refill cannot be delegated      Failed - Urine Drug Screen completed in last 360 days      Failed - Valid encounter within last 6 months    Recent Outpatient Visits           1 month ago Annual physical exam   05/01/2020 [place], [employer], PA-C   1 year ago Anxiety, generalized   [facility] [place], [place], [place]   1 year ago Panic disorder with agoraphobia and severe panic attacks   [place], [employer], PA-C   1 year ago Pain, dental   [facility] [place], [place], [place]   1 year ago Urethral discharge in male   [facility] [place], [facility], [place] Cipro

## 2020-06-02 ENCOUNTER — Other Ambulatory Visit: Payer: Self-pay | Admitting: Family Medicine

## 2020-06-02 DIAGNOSIS — F4001 Agoraphobia with panic disorder: Secondary | ICD-10-CM

## 2020-06-02 NOTE — Telephone Encounter (Signed)
Requested medication (s) are due for refill today: yes  Requested medication (s) are on the active medication list: yes  Last refill:  05/01/20 #30 0 refills  Future visit scheduled: no , seen 2 months ago   Notes to clinic:  not delegated per protocol     Requested Prescriptions  Pending Prescriptions Disp Refills   diazepam (VALIUM) 10 MG tablet [Pharmacy Med Name: DIAZEPAM 10MG  TABLETS] 30 tablet     Sig: TAKE 1 TABLET(10 MG) BY MOUTH DAILY AS NEEDED      Not Delegated - Psychiatry:  Anxiolytics/Hypnotics Failed - 06/02/2020 10:31 AM      Failed - This refill cannot be delegated      Failed - Urine Drug Screen completed in last 360 days      Failed - Valid encounter within last 6 months    Recent Outpatient Visits           2 months ago Annual physical exam   06/04/2020 [place], [employer], PA-C   1 year ago Anxiety, generalized   [facility] [place], [place], [place]   1 year ago Panic disorder with agoraphobia and severe panic attacks   [place], [employer], PA-C   1 year ago Pain, dental   [facility] [place], [place], [place]   2 years ago Urethral discharge in male   [facility] [place], [facility], [place] Cipro

## 2020-07-06 ENCOUNTER — Other Ambulatory Visit: Payer: Self-pay | Admitting: Family Medicine

## 2020-07-06 DIAGNOSIS — F4001 Agoraphobia with panic disorder: Secondary | ICD-10-CM

## 2020-07-06 NOTE — Telephone Encounter (Signed)
Requested medications are due for refill today.  yes  Requested medications are on the active medications list.  yes  Last refill. 06/03/2020  Future visit scheduled.   No  Notes to clinic.  Medication not delegated.

## 2020-07-07 NOTE — Telephone Encounter (Signed)
Not a patient of [facility] please route acordingly

## 2020-08-05 ENCOUNTER — Ambulatory Visit: Payer: Self-pay | Admitting: *Deleted

## 2020-08-05 NOTE — Telephone Encounter (Signed)
Fyi. Thanks.

## 2020-08-05 NOTE — Telephone Encounter (Signed)
Patient called and he says he has abdominal pain that is mild at this time at a 3, but says the pain started on Friday 07/31/20 and has been constant. He says the pain is all over his abdomen and doesn't move anywhere else, just stays there. He says his last BM was yesterday and constipated. He says he doesn't take anything for his bowels. He says what eases his pain is drinking almond milk. He says he has nausea, but no other symptoms. Advised no availability that I can schedule. Called the office and spoke to [place], [place] about appointments, she advised no availability with any provider until 6/20, so patient will need to go to the UC. Patient advised to go to the UC, patient verbalized understanding.   Summary: stomach discomfort   Patient has experienced stomach discomfort since Friday 07/31/20   Patient is experiencing bloating as well as nausea   Patient has experienced constipation as well   Please contact to further advise when possible      Reason for Disposition . [1] MILD pain (e.g., does not interfere with normal activities) AND [2] pain comes and goes (cramps) [3] present > 48 hours  (Exception: this same abdominal pain is a chronic symptom recurrent or ongoing AND present > 4 weeks)  Answer Assessment - Initial Assessment Questions 1. LOCATION: "Where does it hurt?"      All over 2. RADIATION: "Does the pain shoot anywhere else?" (e.g., chest, back)     No 3. ONSET: "When did the pain begin?" (Minutes, hours or days ago)      Friday, 07/31/20 4. SUDDEN: "Gradual or sudden onset?"     Gradual 5. PATTERN "Does the pain come and go, or is it constant?"    - If constant: "Is it getting better, staying the same, or worsening?"      (Note: Constant means the pain never goes away completely; most serious pain is constant and it progresses)     - If intermittent: "How long does it last?" "Do you have pain now?"     (Note: Intermittent means the pain goes away completely between  bouts)     Constant 6. SEVERITY: "How bad is the pain?"  (e.g., Scale 1-10; mild, moderate, or severe)    - MILD (1-3): doesn't interfere with normal activities, abdomen soft and not tender to touch     - MODERATE (4-7): interferes with normal activities or awakens from sleep, abdomen tender to touch     - SEVERE (8-10): excruciating pain, doubled over, unable to do any normal activities       3-dull, achy 7. RECURRENT SYMPTOM: "Have you ever had this type of stomach pain before?" If Yes, ask: "When was the last time?" and "What happened that time?"      Yes, I have pain in my stomach off and on, but this is a different type of pain 8. CAUSE: "What do you think is causing the stomach pain?"     I don't know 9. RELIEVING/AGGRAVATING FACTORS: "What makes it better or worse?" (e.g., movement, antacids, bowel movement)     Almond milk eases the stomach, then starts back 10. OTHER SYMPTOMS: "Do you have any other symptoms?" (e.g., back pain, diarrhea, fever, urination pain, vomiting)      Nausea  Protocols used: ABDOMINAL PAIN - MALE-A-AH

## 2020-08-06 ENCOUNTER — Encounter: Payer: Self-pay | Admitting: Family Medicine

## 2020-08-06 DIAGNOSIS — J029 Acute pharyngitis, unspecified: Secondary | ICD-10-CM | POA: Diagnosis not present

## 2020-08-11 ENCOUNTER — Other Ambulatory Visit: Payer: Self-pay | Admitting: Family Medicine

## 2020-08-11 DIAGNOSIS — F4001 Agoraphobia with panic disorder: Secondary | ICD-10-CM

## 2020-08-11 NOTE — Telephone Encounter (Signed)
Requested medications are due for refill today.  yes  Requested medications are on the active medications list.  yes  Last refill. 07/09/2020  Future visit scheduled.   no  Notes to clinic.  Medication not delegated.

## 2020-08-30 ENCOUNTER — Other Ambulatory Visit: Payer: Self-pay | Admitting: Family Medicine

## 2020-08-30 DIAGNOSIS — K219 Gastro-esophageal reflux disease without esophagitis: Secondary | ICD-10-CM

## 2020-08-30 NOTE — Telephone Encounter (Signed)
Requested Prescriptions  Pending Prescriptions Disp Refills  . pantoprazole (PROTONIX) 40 MG tablet [Pharmacy Med Name: PANTOPRAZOLE 40MG  TABLETS] 90 tablet 1    Sig: TAKE 1 TABLET BY MOUTH EVERY DAY 30 MINUTES BEFORE A MEAL     Gastroenterology: Proton Pump Inhibitors Failed - 08/30/2020  3:38 PM      Failed - Valid encounter within last 12 months    Recent Outpatient Visits          5 months ago Annual physical exam   09/01/2020, [employer], PA-C   1 year ago Anxiety, generalized   [facility] [place], [place], [place]   1 year ago Panic disorder with agoraphobia and severe panic attacks   [place], [employer], PA-C   2 years ago Pain, dental   [facility] M, M   2 years ago Urethral discharge in male   [facility] [place], [facility], PA-C             Pt had CPE 5 months ago

## 2020-09-16 ENCOUNTER — Other Ambulatory Visit: Payer: Self-pay | Admitting: Family Medicine

## 2020-09-16 DIAGNOSIS — F4001 Agoraphobia with panic disorder: Secondary | ICD-10-CM

## 2020-10-18 ENCOUNTER — Other Ambulatory Visit: Payer: Self-pay | Admitting: Family Medicine

## 2020-10-18 DIAGNOSIS — F4001 Agoraphobia with panic disorder: Secondary | ICD-10-CM

## 2020-10-18 NOTE — Telephone Encounter (Signed)
Called pt try and make appt- When I asked for his birthday either the call dropped or he hung up. Called back and LM on VM for him to make appointment.  Last RF 09/16/20 #30 RF due Is on the active med list.  Requested Prescriptions  Pending Prescriptions Disp Refills   diazepam (VALIUM) 10 MG tablet [Pharmacy Med Name: DIAZEPAM 10MG  TABLETS] 30 tablet     Sig: TAKE 1 TABLET(10 MG) BY MOUTH DAILY AS NEEDED     Not Delegated - Psychiatry:  Anxiolytics/Hypnotics Failed - 10/18/2020  8:15 AM      Failed - This refill cannot be delegated      Failed - Urine Drug Screen completed in last 360 days      Failed - Valid encounter within last 6 months    Recent Outpatient Visits           7 months ago Annual physical exam   10/20/2020, [employer], PA-C   1 year ago Anxiety, generalized   [facility] [place], [place], [place]   2 years ago Panic disorder with agoraphobia and severe panic attacks   [place], [employer], PA-C   2 years ago Pain, dental   [facility] [place], [place], [place]   2 years ago Urethral discharge in male   [facility] [place], [facility], Jodell Cipro

## 2020-10-20 ENCOUNTER — Other Ambulatory Visit: Payer: Self-pay | Admitting: *Deleted

## 2020-10-20 ENCOUNTER — Encounter: Payer: Self-pay | Admitting: Family Medicine

## 2020-10-20 DIAGNOSIS — G4700 Insomnia, unspecified: Secondary | ICD-10-CM

## 2020-10-20 DIAGNOSIS — F4001 Agoraphobia with panic disorder: Secondary | ICD-10-CM

## 2020-10-20 NOTE — Telephone Encounter (Signed)
Please advise refill? LOV: 03/16/2020--Dennis [place]ismon NOV: None  Diazepam 10 mg  Last refill: 09/16/2020 #30 with 0/refills  Zolpidem 10 mg Last refill: 10/01/2018 #30 with 0/refills

## 2020-10-21 NOTE — Telephone Encounter (Signed)
Another refill request was sent to the provider.

## 2020-10-22 ENCOUNTER — Encounter: Payer: Self-pay | Admitting: Family Medicine

## 2020-10-22 DIAGNOSIS — G4700 Insomnia, unspecified: Secondary | ICD-10-CM

## 2020-10-22 DIAGNOSIS — F4001 Agoraphobia with panic disorder: Secondary | ICD-10-CM

## 2020-10-22 MED ORDER — DIAZEPAM 10 MG PO TABS: 10.0000 mg | ORAL_TABLET | Freq: Two times a day (BID) | ORAL | 0 refills | Status: DC | PRN

## 2020-10-22 MED ORDER — ZOLPIDEM TARTRATE 10 MG PO TABS: 10.0000 mg | ORAL_TABLET | Freq: Every evening | ORAL | 0 refills | Status: DC | PRN

## 2020-10-22 MED ORDER — DIAZEPAM 10 MG PO TABS
10.0000 mg | ORAL_TABLET | Freq: Two times a day (BID) | ORAL | 0 refills | Status: DC | PRN
Start: 2020-10-22 — End: 2021-01-01

## 2020-12-31 ENCOUNTER — Other Ambulatory Visit: Payer: Self-pay | Admitting: Family Medicine

## 2020-12-31 DIAGNOSIS — F4001 Agoraphobia with panic disorder: Secondary | ICD-10-CM

## 2020-12-31 NOTE — Telephone Encounter (Signed)
Pt made an appt with Dr. Sullivan Lone but needs his refill asap.

## 2021-01-01 ENCOUNTER — Other Ambulatory Visit: Payer: Self-pay

## 2021-01-01 DIAGNOSIS — F4001 Agoraphobia with panic disorder: Secondary | ICD-10-CM

## 2021-01-01 NOTE — Telephone Encounter (Signed)
Copied from CRM [id number]. Topic: Appointment Scheduling - Scheduling Inquiry for Clinic >> Dec 31, 2020  4:40 PM Daphine Deutscher D wrote: Reason for CRM: Pt called about his refill on his anxiety medication.   He made an appt with Dr. Sullivan Lone since he filled it last time.  The appt is in Feb  wait listed.  He wants to know if Dr. Sullivan Lone will refill this since he made an  appt.

## 2021-01-01 NOTE — Addendum Note (Signed)
Addended by: Marlene Lard on: 01/01/2021 04:55 PM   Modules accepted: Orders

## 2021-01-02 MED ORDER — DIAZEPAM 10 MG PO TABS: 10.0000 mg | ORAL_TABLET | Freq: Two times a day (BID) | ORAL | 0 refills | Status: DC | PRN

## 2021-02-07 ENCOUNTER — Other Ambulatory Visit: Payer: Self-pay | Admitting: Family Medicine

## 2021-02-07 DIAGNOSIS — F4001 Agoraphobia with panic disorder: Secondary | ICD-10-CM

## 2021-02-08 NOTE — Telephone Encounter (Signed)
LOV: 03/16/2020  NOV: 04/12/2021   Thanks,   -Vernona Rieger

## 2021-02-08 NOTE — Telephone Encounter (Signed)
Requested medication (s) are due for refill today: Yes  Requested medication (s) are on the active medication list: Yes  Last refill:  01/02/21  Future visit scheduled: Yes  Notes to clinic:  See request.    Requested Prescriptions  Pending Prescriptions Disp Refills   diazepam (VALIUM) 10 MG tablet [Pharmacy Med Name: DIAZEPAM 10MG  TABLETS] 30 tablet     Sig: TAKE 1 TABLET BY MOUTH EVERY 12 HOURS AS NEEDED FOR ANXIETY. WILL NOT REFILL AGAIN WITHOUT APPOINTMENT     Not Delegated - Psychiatry:  Anxiolytics/Hypnotics Failed - 02/07/2021  4:45 PM      Failed - This refill cannot be delegated      Failed - Urine Drug Screen completed in last 360 days      Failed - Valid encounter within last 6 months    Recent Outpatient Visits           10 months ago Annual physical exam   14/06/2020 [place], [employer], PA-C   1 year ago Anxiety, generalized   [facility] [place], [place], [place]   2 years ago Panic disorder with agoraphobia and severe panic attacks   [place], [employer], PA-C   2 years ago Pain, dental   [facility] M, M   2 years ago Urethral discharge in male   [facility] [place], [facility], PA-C       Future Appointments             In 2 months Jodell Cipro., MD [facility], [facility]

## 2021-03-02 ENCOUNTER — Telehealth: Payer: Self-pay | Admitting: Family Medicine

## 2021-03-02 DIAGNOSIS — K219 Gastro-esophageal reflux disease without esophagitis: Secondary | ICD-10-CM

## 2021-03-02 MED ORDER — PANTOPRAZOLE SODIUM 40 MG PO TBEC: DELAYED_RELEASE_TABLET | ORAL | 0 refills | Status: DC

## 2021-03-02 NOTE — Telephone Encounter (Signed)
[facility] faxed refill request for the following medications:   pantoprazole (PROTONIX) 40 MG tablet    Please advise.  

## 2021-04-12 ENCOUNTER — Ambulatory Visit (INDEPENDENT_AMBULATORY_CARE_PROVIDER_SITE_OTHER): Payer: BC Managed Care – PPO | Admitting: Family Medicine

## 2021-04-12 ENCOUNTER — Ambulatory Visit
Admission: RE | Admit: 2021-04-12 | Discharge: 2021-04-12 | Disposition: A | Discharge status: Home / Self Care | Payer: BC Managed Care – PPO | Source: Ambulatory Visit | Attending: Family Medicine | Admitting: Family Medicine

## 2021-04-12 ENCOUNTER — Other Ambulatory Visit: Payer: Self-pay

## 2021-04-12 ENCOUNTER — Ambulatory Visit
Admission: RE | Admit: 2021-04-12 | Discharge: 2021-04-12 | Disposition: A | Discharge status: Home / Self Care | Payer: BC Managed Care – PPO | Attending: Family Medicine | Admitting: Family Medicine

## 2021-04-12 ENCOUNTER — Encounter: Payer: Self-pay | Admitting: Family Medicine

## 2021-04-12 VITALS — BP 127/85 | HR 86 | Temp 98.5°F | Resp 16 | Ht 70.0 in | Wt 184.0 lb

## 2021-04-12 DIAGNOSIS — M542 Cervicalgia: Secondary | ICD-10-CM | POA: Diagnosis not present

## 2021-04-12 DIAGNOSIS — K219 Gastro-esophageal reflux disease without esophagitis: Secondary | ICD-10-CM | POA: Diagnosis not present

## 2021-04-12 DIAGNOSIS — R140 Abdominal distension (gaseous): Secondary | ICD-10-CM | POA: Diagnosis not present

## 2021-04-12 DIAGNOSIS — F4001 Agoraphobia with panic disorder: Secondary | ICD-10-CM | POA: Diagnosis not present

## 2021-04-12 MED ORDER — DIAZEPAM 10 MG PO TABS: ORAL_TABLET | ORAL | 5 refills | Status: DC

## 2021-04-12 MED ORDER — PANTOPRAZOLE SODIUM 40 MG PO TBEC: 80.0000 mg | DELAYED_RELEASE_TABLET | Freq: Every day | ORAL | 3 refills | Status: DC

## 2021-04-12 NOTE — Patient Instructions (Signed)
Bobby [name]  [phone] 

## 2021-04-12 NOTE — Progress Notes (Signed)
Established patient visit  I,April Miller,acting as a scribe for Megan Mans, MD.,have documented all relevant documentation on the behalf of Megan Mans, MD,as directed by  Megan Mans, MD while in the presence of Megan Mans, MD.   Patient: Bobby [name]   DOB: [birth year]   36 y.o. 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  He works for shift. He has chronic anxiety but states that he does not like the way the antidepressants made him feel.  He takes Valium 10 mg one half twice daily.  One half in the morning and one half at bedtime.  He states he no longer takes Ambien.  He brings in an FMLA that needs to be filled out for this panic attacks and for chronic reflux symptoms.  He did see GI back in 2019.  He does not wish to see GI again at this point. The other issue today in addition to the above is that he was getting into his truck late last year and hit his head and since then he has had chronic lower neck pain 8080 towards both shoulders.  He had no syncope but it sounds like he suffered a mild concussion at the time.  No focal neurologic deficit in his upper extremities. Follow up for Panic disorder with agoraphobia  The patient was last seen for this 13 months ago. Changes made at last visit include; Continues to have difficulty in crowds and panic if he doesn't take 1/2 tablet of Valium 10 mg during the afternoon and the other 1/2 at bedtime. Marland Kitchen  He reports good compliance with treatment. He feels that condition is Unchanged. He is not having side effects. none  -----------------------------------------------------------------------------------------   Medications: Outpatient Medications Prior to Visit  Medication Sig   diazepam  (VALIUM) 10 MG tablet TAKE 1 TABLET BY MOUTH EVERY 12 HOURS AS NEEDED FOR ANXIETY. WILL NOT REFILL AGAIN WITHOUT APPOINTMENT   pantoprazole (PROTONIX) 40 MG tablet TAKE 1 TABLET BY MOUTH EVERY DAY 30 MINUTES BEFORE A MEAL   zolpidem (AMBIEN) 10 MG tablet Take 1 tablet (10 mg total) by mouth at bedtime as needed for sleep.   [DISCONTINUED] fluticasone (FLONASE) 50 MCG/ACT nasal spray Place 2 sprays into both nostrils daily. (Patient not taking: Reported on 03/16/2020)   [DISCONTINUED] loratadine (CLARITIN) 10 MG tablet Take 1 tablet (10 mg total) by mouth daily. (Patient not taking: Reported on 03/16/2020)   No facility-administered medications prior to visit.    Review of Systems  Constitutional:  Negative for appetite change, chills and fever.  Respiratory:  Negative for chest tightness, shortness of breath and wheezing.   Cardiovascular:  Negative for chest pain and palpitations.  Gastrointestinal:  Negative for abdominal pain, nausea and vomiting.       Objective    BP 127/85 (BP Location: Left Arm, Patient Position: Sitting, Cuff Size: Large)    Pulse 86    Temp 98.5 F (36.9 C) (Temporal)    Resp 16    Ht 5\' 10"  (1.778 m)    Wt 184 lb (83.5 kg)    SpO2 99%    BMI 26.40 kg/m     Physical Exam Vitals and nursing note reviewed.  Constitutional:  General: He is not in acute distress.    Appearance: He is well-developed.  HENT:     Head: Normocephalic and atraumatic.     Right Ear: Hearing normal.     Left Ear: Hearing normal.     Nose: Nose normal.  Eyes:     General: Lids are normal. No scleral icterus.       Right eye: No discharge.        Left eye: No discharge.     Conjunctiva/sclera: Conjunctivae normal.  Cardiovascular:     Rate and Rhythm: Normal rate and regular rhythm.     Heart sounds: Normal heart sounds.  Pulmonary:     Effort: Pulmonary effort is normal. No respiratory distress.  Abdominal:     General: Bowel sounds are normal.     Palpations: Abdomen is  soft.  Musculoskeletal:        General: No tenderness.     Cervical back: Normal range of motion and neck supple.  Skin:    General: Skin is warm and dry.     Findings: No lesion or rash.  Neurological:     Mental Status: He is alert and oriented to person, place, and time.     Comments: Strength is normal in both upper extremities  Psychiatric:        Attention and Perception: Attention normal.        Mood and Affect: Mood is anxious.        Speech: Speech normal.        Behavior: Behavior is agitated.        Thought Content: Thought content is paranoid.        Cognition and Memory: Cognition normal.        Judgment: Judgment normal.      No results found for any visits on 04/12/21.  Assessment & Plan     1. Panic disorder with agoraphobia and severe panic attacks Will refer to Ram[facility] Dial for counseling.  Continue the diazepam at 10 mg one half twice daily as needed.  Will not refill for any more than that.  Would like to try an SSRI again. - diazepam (VALIUM) 10 MG tablet; Take 1/2 Tablet Twice Daily As Needed  Dispense: 30 tablet; Refill: 5  2. Gastroesophageal reflux disease, unspecified whether esophagitis present Double Protonix to twice daily.  Follow-up in 3 months  3. Neck pain I do not think the trauma from hitting his head is a problem but will use ice and heat but will x-ray to make sure there is no issue today. - DG Cervical Spine Complete  4. Gastroesophageal reflux disease with some bloating As above.  He declines GI referral. - pantoprazole (PROTONIX) 40 MG tablet; Take 2 tablets (80 mg total) by mouth daily.  Dispense: 180 tablet; Refill: 3   No follow-ups on file.      I, Megan Mans, MD, have reviewed all documentation for this visit. The documentation on 04/12/21 for the exam, diagnosis, procedures, and orders are all accurate and complete.    Bobby [name] Wendelyn Breslow, MD  [facility] [phone] (phone) [phone]  (fax)  [facility]

## 2021-04-21 ENCOUNTER — Encounter: Payer: Self-pay | Admitting: Family Medicine

## 2021-06-01 ENCOUNTER — Other Ambulatory Visit: Payer: Self-pay | Admitting: Family Medicine

## 2021-06-01 DIAGNOSIS — K219 Gastro-esophageal reflux disease without esophagitis: Secondary | ICD-10-CM

## 2021-07-02 ENCOUNTER — Telehealth: Payer: Self-pay | Admitting: Family Medicine

## 2021-07-02 NOTE — Telephone Encounter (Signed)
[facility] faxed refill request for the following medications:   pantoprazole (PROTONIX) 40 MG tablet    Please advise.  

## 2021-07-05 ENCOUNTER — Other Ambulatory Visit: Payer: Self-pay

## 2021-07-05 ENCOUNTER — Encounter: Payer: Self-pay | Admitting: Family Medicine

## 2021-07-05 DIAGNOSIS — K219 Gastro-esophageal reflux disease without esophagitis: Secondary | ICD-10-CM

## 2021-07-05 MED ORDER — PANTOPRAZOLE SODIUM 40 MG PO TBEC: 80.0000 mg | DELAYED_RELEASE_TABLET | Freq: Every day | ORAL | 3 refills | Status: DC

## 2021-07-08 NOTE — Telephone Encounter (Signed)
New rx was sent to pharmacy

## 2021-07-14 ENCOUNTER — Ambulatory Visit: Payer: Self-pay | Admitting: *Deleted

## 2021-07-14 NOTE — Telephone Encounter (Signed)
Summary: rx concerns  ? The patient would like to speak with a member of staff about concerns related to their pantoprazole (PROTONIX) 40 MG tablet [id number]  refill  ? ?Please contact further when available   ?  ?Patient states he is having trouble with his Rx- his insurance is not paying for it. Omeprazole does not work for him. Patient could not say what the problem is with the insurance- advised patient I would call the pharmacy to see what needs to be done. ?Call to pharmacy- because Rx is for 2 40 mg tablets- patient insurance will not pay- needs PA. Pharmacy will send over another PA for this medication.  ?Patient notified of process and why his insurance was not covering medication- he will await office response/PA. ?Reason for Disposition ? [1] Caller has URGENT medicine question about med that PCP or specialist prescribed AND [2] triager unable to answer question ? ?Answer Assessment - Initial Assessment Questions ?1. NAME of MEDICATION: "What medicine are you calling about?" ?    Protonix 40 mg ?2. QUESTION: "What is your question?" (e.g., double dose of medicine, side effect) ?    Unable to get insurance coverage ?3. PRESCRIBING HCP: "Who prescribed it?" Reason: if prescribed by specialist, call should be referred to that group. ?    PCP ?4. SYMPTOMS: "Do you have any symptoms?" ?    reflux ?Patient is not sure why he is unable to get insurance to cover his medication. ?Call to pharmacy- high dose- 2 tablets- insurance not covering- needs PA ?Pharmacy is going to send another PA request- not sure if this has been done yet- please alert patient with progress- also is there anything he can take while waiting- omeprazole is not helpful ? ?Protocols used: Medication Question Call-A-AH ? ?

## 2021-07-16 NOTE — Telephone Encounter (Signed)
Cover able to use insurance listed. Have not receive PA fax yet. ?

## 2021-08-05 ENCOUNTER — Other Ambulatory Visit: Payer: Self-pay

## 2021-08-05 ENCOUNTER — Telehealth: Payer: Self-pay | Admitting: Family Medicine

## 2021-08-05 DIAGNOSIS — K219 Gastro-esophageal reflux disease without esophagitis: Secondary | ICD-10-CM

## 2021-08-05 MED ORDER — PANTOPRAZOLE SODIUM 40 MG PO TBEC: 40.0000 mg | DELAYED_RELEASE_TABLET | Freq: Every day | ORAL | 3 refills | Status: DC

## 2021-08-05 NOTE — Telephone Encounter (Signed)
Pts insurance wont cover 180 tabs so pt asked last month if the RX can be lowered to 30 day supply / pt needs refill asap of pantoprazole (PROTONIX) 40 MG tablet sent to [place] [place] [facility] [place]  [address] [place], [place] [address]  Phone:  [phone]  Fax:  [phone]

## 2021-08-05 NOTE — Telephone Encounter (Signed)
[place] called and spoke to [place], [facility] about the refill(s) Pantoprazole requested. Advised what the patient says and asked did they receive the 90 day sent in today. He says the insurance will only pay for once a day not twice a day as it was originally ordered, so the provider sent in the 90 day that is ready for pickup.

## 2021-08-10 NOTE — Progress Notes (Unsigned)
I,Sulibeya S Dimas,acting as a scribe for Mila Merry, MD.,have documented all relevant documentation on the behalf of Mila Merry, MD,as directed by  Mila Merry, MD while in the presence of Mila Merry, MD.   Established patient visit   Patient: Bobby [name]   DOB: 06-30-1985   36 y.o. Male  MRN: [id number] Visit Date: 08/11/2021  Today's healthcare provider: Mila Merry, MD   Chief Complaint  Patient presents with   Gastroesophageal Reflux   Subjective    HPI  GERD, Follow up:  The patient was last seen for GERD 3 months ago. Changes made since that visit was changing prescription for pantoprazole from 40 QAM to 40 BID since he had been having heartburn in the afternoons and evenings. Prior to that he had been taking 40QAM for years which had been effective. He states he did have GI work up several years ago and had since been well controlled on QAM pantoprazole.   Unfortunately his insurance denies prescription for BID pantoprazole so he was unable to get it refilled for several weeks. When he ran out he tried OTC Prilosec which he states was not effective. Prescription was changed back to QAM last week and he able to get back on medication about a week ago. As before, reflux is now well controlled in the morning and afternoon, but flaring back up in the evenings.   He is NOT experiencing abdominal bloating, belching, belching and eructation, choking on food, cough, difficulty swallowing, dysphagia, fullness after meals, heartburn, nausea, shortness of breath, unexpected weight loss, or upper abdominal discomfort  -----------------------------------------------------------------------------------------  Anxiety, Follow-up  He was last seen for anxiety 2 months ago. Changes made at last visit include no changes.   He reports excellent compliance with treatment. He reports excellent tolerance of treatment. He is not having side effects.   He feels his anxiety  is mild and Improved since last visit.  Symptoms: No chest pain No difficulty concentrating  No dizziness No fatigue  No feelings of losing control No insomnia  No irritable No palpitations  Yes panic attacks Yes racing thoughts  No shortness of breath No sweating  No tremors/shakes    GAD-7 Results    08/11/2021    8:26 AM  GAD-7 Generalized Anxiety Disorder Screening Tool  1. Feeling Nervous, Anxious, or on Edge 1  2. Not Being Able to Stop or Control Worrying 1  3. Worrying Too Much About Different Things 0  4. Trouble Relaxing 0  5. Being So Restless it's Hard To Sit Still 0  6. Becoming Easily Annoyed or Irritable 0  7. Feeling Afraid As If Something Awful Might Happen 0  Total GAD-7 Score 2  Difficulty At Work, Home, or Getting  Along With Others? Not difficult at all    PHQ-9 Scores    08/11/2021    8:27 AM 04/12/2021    8:11 AM 03/16/2020   10:49 AM  PHQ9 SCORE ONLY  PHQ-9 Total Score 1 5 8     ---------------------------------------------------------------------------------------------------   Medications: Outpatient Medications Prior to Visit  Medication Sig   diazepam (VALIUM) 10 MG tablet Take 1/2 Tablet Twice Daily As Needed   pantoprazole (PROTONIX) 40 MG tablet Take 1 tablet (40 mg total) by mouth daily.   zolpidem (AMBIEN) 10 MG tablet Take 1 tablet (10 mg total) by mouth at bedtime as needed for sleep.   No facility-administered medications prior to visit.    Review of Systems  Constitutional:  Negative for  appetite change, diaphoresis and fatigue.  Respiratory:  Negative for chest tightness and shortness of breath.   Cardiovascular:  Negative for chest pain and palpitations.  Gastrointestinal:  Negative for abdominal distention, abdominal pain, diarrhea, nausea and vomiting.  Neurological:  Negative for dizziness, tremors and headaches.  Psychiatric/Behavioral:  Positive for decreased concentration. Negative for sleep disturbance. The patient is  nervous/anxious.        Objective    BP 112/86 (BP Location: Right Arm, Patient Position: Sitting, Cuff Size: Large)   Pulse 77   Temp 97.8 F (36.6 C) (Oral)   Resp 16   Wt 187 lb 4.8 oz (85 kg)   BMI 26.87 kg/m  BP Readings from Last 3 Encounters:  08/11/21 112/86  04/12/21 127/85  03/16/20 106/73   Wt Readings from Last 3 Encounters:  08/11/21 187 lb 4.8 oz (85 kg)  04/12/21 184 lb (83.5 kg)  03/16/20 187 lb (84.8 kg)      Physical Exam   General appearance:  Overweight male, cooperative and in no acute distress Head: Normocephalic, without obvious abnormality, atraumatic Respiratory: Respirations even and unlabored, normal respiratory rate Extremities: All extremities are intact.  Skin: Skin color, texture, turgor normal. No rashes seen  Psych: Appropriate mood and affect. Neurologic: Mental status: Alert, oriented to person, place, and time, thought content appropriate.    Assessment & Plan     1. Gastroesophageal reflux disease Had been controlled with QAM pantoprazole for several years, but now seems to be wearing off and having heartburn in the evenings. He just had 90 tablets dispensed last week. Will go ahead and increase to BID with his current bottle. Will send in new prescription with new instructions in about 2 weeks and see if we get a PA for BID. If unable to get approval for BID dosing or if BID dosing is not effective, will try adding 40mg  famotidine in the afternoon.       The entirety of the information documented in the History of Present Illness, Review of Systems and Physical Exam were personally obtained by me. Portions of this information were initially documented by the CMA and reviewed by me for thoroughness and accuracy.     , MD  [facility] [phone] (phone) [phone] (fax)  [facility]

## 2021-08-11 ENCOUNTER — Ambulatory Visit (INDEPENDENT_AMBULATORY_CARE_PROVIDER_SITE_OTHER): Payer: BC Managed Care – PPO | Admitting: Family Medicine

## 2021-08-11 ENCOUNTER — Ambulatory Visit: Payer: BC Managed Care – PPO | Admitting: Family Medicine

## 2021-08-11 ENCOUNTER — Encounter: Payer: Self-pay | Admitting: Family Medicine

## 2021-08-11 VITALS — BP 112/86 | HR 77 | Temp 97.8°F | Resp 16 | Wt 187.3 lb

## 2021-08-11 DIAGNOSIS — K219 Gastro-esophageal reflux disease without esophagitis: Secondary | ICD-10-CM

## 2021-08-11 MED ORDER — PANTOPRAZOLE SODIUM 40 MG PO TBEC: 40.0000 mg | DELAYED_RELEASE_TABLET | Freq: Two times a day (BID) | ORAL | Status: DC

## 2021-08-11 NOTE — Patient Instructions (Signed)
.   Please review the attached list of medications and notify my office if there are any errors.   . Please bring all of your medications to every appointment so we can make sure that our medication list is the same as yours.   

## 2021-08-24 ENCOUNTER — Other Ambulatory Visit: Payer: Self-pay | Admitting: Family Medicine

## 2021-08-24 DIAGNOSIS — K219 Gastro-esophageal reflux disease without esophagitis: Secondary | ICD-10-CM

## 2021-08-24 MED ORDER — PANTOPRAZOLE SODIUM 40 MG PO TBEC: 40.0000 mg | DELAYED_RELEASE_TABLET | Freq: Two times a day (BID) | ORAL | 4 refills | Status: DC

## 2021-08-26 ENCOUNTER — Telehealth: Payer: Self-pay | Admitting: Family Medicine

## 2021-08-26 NOTE — Telephone Encounter (Signed)
Patient's pantoprazole was increased to BID on 08-24-21. Was previously denied by his insurance requiring a PA. Please check with patient or with [facility] [address] to make sure he was able to get prescription for 180 tablets or if PA needs to be done.

## 2021-08-26 NOTE — Telephone Encounter (Signed)
Yes, per pharmacist pt was dispensed #90.  This was from previous Rx on 08-05-21 and pt was made aware to take bid. New Rx will not go through (ins) until 09-08-21.  We will see if PA is required at that time.

## 2021-09-10 ENCOUNTER — Other Ambulatory Visit: Payer: Self-pay | Admitting: Family Medicine

## 2021-09-10 DIAGNOSIS — K219 Gastro-esophageal reflux disease without esophagitis: Secondary | ICD-10-CM

## 2021-09-10 MED ORDER — PANTOPRAZOLE SODIUM 40 MG PO TBEC: 40.0000 mg | DELAYED_RELEASE_TABLET | Freq: Two times a day (BID) | ORAL | 4 refills | Status: DC

## 2021-10-13 ENCOUNTER — Encounter: Payer: Self-pay | Admitting: Family Medicine

## 2021-10-18 ENCOUNTER — Telehealth: Payer: Self-pay

## 2021-10-18 NOTE — Telephone Encounter (Signed)
Copied from CRM [id number]. Topic: General - Inquiry >> Oct 18, 2021  1:57 PM De Blanch wrote: Reason for CRM: Pt is calling stated sent a message via MyChart with FMLA paperwork and has not heard back. Pt stated he only has 18 days to get this filled out.   Please advise. >> Oct 18, 2021  2:00 PM De Blanch wrote: 18 days from the day the message was sent.

## 2021-10-18 NOTE — Telephone Encounter (Signed)
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 Please review message and update patient regarding what he needs to do to have FMLA paperwork filled out. Dr. Sherrie Mustache only seen patient on 08/11/2021 for refill on GERD medication. There was no mention about needing FMLA paperwork updated at that time. Looks like patient sent a mychart message to Dr. Sherrie Mustache on 10/13/2021 asking if he could complete the FMLA paperwork. See mychart message for the attached FMLA forms.

## 2021-10-18 NOTE — Telephone Encounter (Signed)
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 Please review message and update patient regarding what he needs to do to have FMLA paperwork filled out. Dr. Sherrie Mustache only seen patient on 08/11/2021 for refill on GERD medication. There was no mention about needing FMLA paperwork updated at that time. Looks like patient sent this mychart message to Dr. Sherrie Mustache on 10/13/2021 asking if he could complete the FMLA paperwork. See mychart message for the attached FMLA forms.

## 2021-10-19 NOTE — Telephone Encounter (Signed)
Printed form, reviewing.

## 2021-10-19 NOTE — Telephone Encounter (Signed)
Form printed and is being reviewed.

## 2021-10-21 NOTE — Telephone Encounter (Signed)
Sent to fax.

## 2021-10-26 ENCOUNTER — Encounter: Payer: Self-pay | Admitting: Family Medicine

## 2021-10-26 ENCOUNTER — Ambulatory Visit (INDEPENDENT_AMBULATORY_CARE_PROVIDER_SITE_OTHER): Payer: BC Managed Care – PPO | Admitting: Family Medicine

## 2021-10-26 VITALS — BP 111/86 | HR 86 | Temp 97.8°F | Resp 18 | Wt 189.0 lb

## 2021-10-26 DIAGNOSIS — F4001 Agoraphobia with panic disorder: Secondary | ICD-10-CM

## 2021-10-26 DIAGNOSIS — K219 Gastro-esophageal reflux disease without esophagitis: Secondary | ICD-10-CM

## 2021-10-26 DIAGNOSIS — Z114 Encounter for screening for human immunodeficiency virus [HIV]: Secondary | ICD-10-CM

## 2021-10-26 DIAGNOSIS — E785 Hyperlipidemia, unspecified: Secondary | ICD-10-CM | POA: Diagnosis not present

## 2021-10-26 MED ORDER — FAMOTIDINE 20 MG PO TABS: 20.0000 mg | ORAL_TABLET | Freq: Every evening | ORAL | 3 refills | Status: DC

## 2021-10-26 MED ORDER — PANTOPRAZOLE SODIUM 40 MG PO TBEC: 40.0000 mg | DELAYED_RELEASE_TABLET | Freq: Every morning | ORAL | 3 refills | Status: DC

## 2021-10-26 MED ORDER — DIAZEPAM 10 MG PO TABS: ORAL_TABLET | ORAL | 5 refills | Status: DC

## 2021-10-26 NOTE — Progress Notes (Signed)
I,Roshena L Chambers,acting as a scribe for Mila Merry, MD.,have documented all relevant documentation on the behalf of Mila Merry, MD,as directed by  Mila Merry, MD while in the presence of Mila Merry, MD.   Established patient visit   Patient: Bobby [name]   DOB: 05/17/1985   36 y.o. Male  MRN: [id number] Visit Date: 10/26/2021  Today's healthcare provider: Mila Merry, MD   Chief Complaint  Patient presents with   Gastroesophageal Reflux   Panic Attack   Subjective    HPI  Follow up for GERD:  The patient was last seen for this on 08/11/2021. Changes made at last visit include increasing pantoprazole to BID.  He reports fair compliance with treatment. Patient reports his insurance would not cover taking Pantoprazole twice a day, so he has been taking one tablet daily. He feels that condition is Unchanged.  Heartburn symptoms were significantly better when he was taking BID He is not having side effects.   -----------------------------------------------------------------------------------------   Panic attacks, Follow-up  He was last seen for this problem 6 months ago. Changes made at last visit include referring to [facility] Dial for counseling. Continue the diazepam at 10 mg one half twice daily as needed.  Will not refill for any more than that.    He reports fair compliance with treatment. Patient was not able to see a counselor due to financial reasons. He reports good tolerance of treatment. He is not having side effects.     Symptoms: No chest pain Yes difficulty concentrating  No dizziness No fatigue  No feelings of losing control No insomnia  No irritable No palpitations  Yes panic attacks Yes racing thoughts  No shortness of breath No sweating  No tremors/shakes    GAD-7 Results    08/11/2021    8:26 AM  GAD-7 Generalized Anxiety Disorder Screening Tool  1. Feeling Nervous, Anxious, or on Edge 1  2. Not Being Able to Stop or  Control Worrying 1  3. Worrying Too Much About Different Things 0  4. Trouble Relaxing 0  5. Being So Restless it's Hard To Sit Still 0  6. Becoming Easily Annoyed or Irritable 0  7. Feeling Afraid As If Something Awful Might Happen 0  Total GAD-7 Score 2  Difficulty At Work, Home, or Getting  Along With Others? Not difficult at all    PHQ-9 Scores    08/11/2021    8:27 AM 04/12/2021    8:11 AM 03/16/2020   10:49 AM  PHQ9 SCORE ONLY  PHQ-9 Total Score 1 5 8     ---------------------------------------------------------------------------------------------------   Medications: Outpatient Medications Prior to Visit  Medication Sig   diazepam (VALIUM) 10 MG tablet Take 1/2 Tablet Twice Daily As Needed   pantoprazole (PROTONIX) 40 MG tablet Take 1 tablet (40 mg total) by mouth 2 (two) times daily. (Patient taking differently: Take 40 mg by mouth daily.)   [DISCONTINUED] zolpidem (AMBIEN) 10 MG tablet Take 1 tablet (10 mg total) by mouth at bedtime as needed for sleep.   No facility-administered medications prior to visit.    Review of Systems  Constitutional:  Negative for appetite change, chills and fever.  Respiratory:  Negative for chest tightness, shortness of breath and wheezing.   Cardiovascular:  Negative for chest pain and palpitations.  Gastrointestinal:  Negative for abdominal pain, nausea and vomiting.  Psychiatric/Behavioral:  Positive for decreased concentration. The patient is nervous/anxious.        Objective    BP  111/86 (BP Location: Right Arm, Patient Position: Sitting, Cuff Size: Large)   Pulse 86   Temp 97.8 F (36.6 C) (Oral)   Resp 18   Wt 189 lb (85.7 kg)   SpO2 98% Comment: room air  BMI 27.12 kg/m    Physical Exam   General appearance: Well developed, well nourished male, cooperative and in no acute distress Head: Normocephalic, without obvious abnormality, atraumatic Respiratory: Respirations even and unlabored, normal respiratory  rate Extremities: All extremities are intact.  Skin: Skin color, texture, turgor normal. No rashes seen  Psych: Appropriate mood and affect. Neurologic: Mental status: Alert, oriented to person, place, and time, thought content appropriate.    Assessment & Plan     1. Gastroesophageal reflux disease without esophagitis Not controlled with QD pantoprazole, but was doing much better with BID dosing which his insurance is refusing to cover. Will resume  pantoprazole (PROTONIX) 40 MG tablet; Take 1 tablet (40 mg total) by mouth every morning.  Dispense: 90 tablet; Refill: 3  And add famotidine (PEPCID) 20 MG tablet; Take 1 tablet (20 mg total) by mouth every evening.  Dispense: 90 tablet; Refill: 3  If not effective then will increase to 40mg . Advised if all else fails he can pay for second daily dose of pantoprazole out of pocket.   2. Hyperlipidemia, unspecified hyperlipidemia type  - Lipid panel - Comprehensive metabolic panel - CBC  3. Encounter for screening for HIV  - HIV Antibody (routine testing w rflx)  4. Panic disorder with agoraphobia and severe panic attacks Well controlled. refill diazepam (VALIUM) 10 MG tablet; Take 1/2 Tablet Twice Daily As Needed  Dispense: 30 tablet; Refill: 5      The entirety of the information documented in the History of Present Illness, Review of Systems and Physical Exam were personally obtained by me. Portions of this information were initially documented by the CMA and reviewed by me for thoroughness and accuracy.     , MD  [facility] [phone] (phone) [phone] (fax)  [facility]

## 2021-10-27 DIAGNOSIS — K219 Gastro-esophageal reflux disease without esophagitis: Secondary | ICD-10-CM | POA: Diagnosis not present

## 2021-10-27 DIAGNOSIS — E785 Hyperlipidemia, unspecified: Secondary | ICD-10-CM | POA: Diagnosis not present

## 2021-10-27 DIAGNOSIS — Z114 Encounter for screening for human immunodeficiency virus [HIV]: Secondary | ICD-10-CM | POA: Diagnosis not present

## 2021-10-28 ENCOUNTER — Encounter: Payer: Self-pay | Admitting: Family Medicine

## 2021-10-28 DIAGNOSIS — E786 Lipoprotein deficiency: Secondary | ICD-10-CM | POA: Insufficient documentation

## 2021-10-28 LAB — CBC
Hematocrit: 43.5 % (ref 37.5–51.0)
Hemoglobin: 15 g/dL (ref 13.0–17.7)
MCH: 31 pg (ref 26.6–33.0)
MCHC: 34.5 g/dL (ref 31.5–35.7)
MCV: 90 fL (ref 79–97)
Platelets: 290 10*3/uL (ref 150–450)
RBC: 4.84 x10E6/uL (ref 4.14–5.80)
RDW: 12.3 % (ref 11.6–15.4)
WBC: 7.2 10*3/uL (ref 3.4–10.8)

## 2021-10-28 LAB — COMPREHENSIVE METABOLIC PANEL
ALT: 42 IU/L (ref 0–44)
AST: 24 IU/L (ref 0–40)
Albumin/Globulin Ratio: 2 (ref 1.2–2.2)
Albumin: 4.5 g/dL (ref 4.1–5.1)
Alkaline Phosphatase: 97 IU/L (ref 44–121)
BUN/Creatinine Ratio: 9 (ref 9–20)
BUN: 9 mg/dL (ref 6–20)
Bilirubin Total: 1 mg/dL (ref 0.0–1.2)
CO2: 23 mmol/L (ref 20–29)
Calcium: 9.5 mg/dL (ref 8.7–10.2)
Chloride: 102 mmol/L (ref 96–106)
Creatinine, Ser: 1 mg/dL (ref 0.76–1.27)
Globulin, Total: 2.2 g/dL (ref 1.5–4.5)
Glucose: 88 mg/dL (ref 70–99)
Potassium: 4.5 mmol/L (ref 3.5–5.2)
Sodium: 141 mmol/L (ref 134–144)
Total Protein: 6.7 g/dL (ref 6.0–8.5)
eGFR: 101 mL/min/{1.73_m2} (ref >59)

## 2021-10-28 LAB — HIV ANTIBODY (ROUTINE TESTING W REFLEX): HIV Screen 4th Generation wRfx: NONREACTIVE

## 2021-10-28 LAB — LIPID PANEL
Chol/HDL Ratio: 6.3 ratio — ABNORMAL HIGH (ref 0.0–5.0)
Cholesterol, Total: 158 mg/dL (ref 100–199)
HDL: 25 mg/dL — ABNORMAL LOW (ref >39)
LDL Chol Calc (NIH): 108 mg/dL — ABNORMAL HIGH (ref 0–99)
Triglycerides: 140 mg/dL (ref 0–149)
VLDL Cholesterol Cal: 25 mg/dL (ref 5–40)

## 2021-11-22 ENCOUNTER — Encounter: Payer: Self-pay | Admitting: Family Medicine

## 2022-06-14 ENCOUNTER — Other Ambulatory Visit: Payer: Self-pay | Admitting: Family Medicine

## 2022-06-14 DIAGNOSIS — F4001 Agoraphobia with panic disorder: Secondary | ICD-10-CM

## 2022-06-14 NOTE — Telephone Encounter (Signed)
Requested medication (s) are due for refill today - yes  Requested medication (s) are on the active medication list -yes  Future visit scheduled -no  Last refill: 10/26/21 #30 5RF  Notes to clinic: non delegated Rx  Requested Prescriptions  Pending Prescriptions Disp Refills   diazepam (VALIUM) 10 MG tablet [Pharmacy Med Name: DIAZEPAM 10MG  TABLETS] 30 tablet     Sig: TAKE 1/2 TABLET BY MOUTH TWICE DAILY AS NEEDED     Not Delegated - Psychiatry: Anxiolytics/Hypnotics 2 Failed - 06/14/2022  7:51 AM      Failed - This refill cannot be delegated      Failed - Urine Drug Screen completed in last 360 days      Failed - Valid encounter within last 6 months    Recent Outpatient Visits           7 months ago Gastroesophageal reflux disease without esophagitis   [facility] Malva Limes, MD   10 months ago Gastroesophageal reflux disease without esophagitis   [facility] Malva Limes, MD   1 year ago Panic disorder with agoraphobia and severe panic attacks   [facility] Bosie Clos, MD   2 years ago Annual physical exam   [facility] Chrismon, Jodell Cipro, PA-C   3 years ago Anxiety, generalized   [facility] [place], [place], [place] - Patient is not pregnant         Requested Prescriptions  Pending Prescriptions Disp Refills   diazepam (VALIUM) 10 MG tablet [Pharmacy Med Name: DIAZEPAM 10MG  TABLETS] 30 tablet     Sig: TAKE 1/2 TABLET BY MOUTH TWICE DAILY AS NEEDED     Not Delegated - Psychiatry: Anxiolytics/Hypnotics 2 Failed - 06/14/2022  7:51 AM      Failed - This refill cannot be delegated      Failed - Urine Drug Screen completed in last 360 days      Failed - Valid encounter within last 6 months    Recent Outpatient Visits           7 months ago Gastroesophageal reflux disease without esophagitis    [place] [facility] Mal[facility] Limes, MD   10 months ago Gastroesophageal reflux disease without esophagitis   [place] [facility] Malva Limes, MD   1 year ago Panic disorder with agoraphobia and severe panic attacks   [place] [facility] Bosie Clos, MD   2 years ago Annual physical exam   [facility] Chrismon, Jodell Cipro, PA-C   3 years ago Anxiety, generalized   [facility] [place], [place], [place] - Patient is not pregnant

## 2022-10-20 ENCOUNTER — Other Ambulatory Visit: Payer: Self-pay | Admitting: Family Medicine

## 2022-10-20 DIAGNOSIS — K219 Gastro-esophageal reflux disease without esophagitis: Secondary | ICD-10-CM

## 2022-10-26 ENCOUNTER — Encounter: Payer: Self-pay | Admitting: Family Medicine

## 2022-10-26 ENCOUNTER — Other Ambulatory Visit: Payer: Self-pay

## 2022-10-26 DIAGNOSIS — K219 Gastro-esophageal reflux disease without esophagitis: Secondary | ICD-10-CM

## 2022-10-26 MED ORDER — PANTOPRAZOLE SODIUM 40 MG PO TBEC: 40.0000 mg | DELAYED_RELEASE_TABLET | Freq: Every morning | ORAL | 0 refills | Status: DC

## 2022-10-27 ENCOUNTER — Other Ambulatory Visit: Payer: Self-pay | Admitting: Family Medicine

## 2022-10-27 DIAGNOSIS — K219 Gastro-esophageal reflux disease without esophagitis: Secondary | ICD-10-CM

## 2022-10-28 NOTE — Telephone Encounter (Signed)
Request was refilled 10/26/22, duplicate request.  Requested Prescriptions  Pending Prescriptions Disp Refills   pantoprazole (PROTONIX) 40 MG tablet [Pharmacy Med Name: PANTOPRAZOLE 40MG  TABLETS] 90 tablet 0    Sig: TAKE 1 TABLET(40 MG) BY MOUTH EVERY MORNING     Gastroenterology: Proton Pump Inhibitors Failed - 10/27/2022  7:10 PM      Failed - Valid encounter within last 12 months    Recent Outpatient Visits           1 year ago Gastroesophageal reflux disease without esophagitis   [place] [facility] Malva Limes, MD   1 year ago Gastroesophageal reflux disease without esophagitis   [place] [facility] Malva Limes, MD   1 year ago Panic disorder with agoraphobia and severe panic attacks   [place] [facility] Bosie Clos, MD   2 years ago Annual physical exam   [facility] Chrismon, Jodell Cipro, PA-C   3 years ago Anxiety, generalized   [facility] [place], [place], [place]

## 2022-11-08 MED ORDER — PANTOPRAZOLE SODIUM 40 MG PO TBEC
40.0000 mg | DELAYED_RELEASE_TABLET | Freq: Every morning | ORAL | 0 refills | Status: DC
Start: 2022-11-08 — End: 2023-02-10

## 2022-12-02 ENCOUNTER — Encounter: Payer: Self-pay | Admitting: Family Medicine

## 2022-12-02 ENCOUNTER — Ambulatory Visit (INDEPENDENT_AMBULATORY_CARE_PROVIDER_SITE_OTHER): Payer: Managed Care, Other (non HMO) | Admitting: Family Medicine

## 2022-12-02 VITALS — BP 126/96 | HR 92 | Ht 70.0 in | Wt 194.3 lb

## 2022-12-02 DIAGNOSIS — E786 Lipoprotein deficiency: Secondary | ICD-10-CM | POA: Diagnosis not present

## 2022-12-02 DIAGNOSIS — Z0001 Encounter for general adult medical examination with abnormal findings: Secondary | ICD-10-CM | POA: Diagnosis not present

## 2022-12-02 DIAGNOSIS — K219 Gastro-esophageal reflux disease without esophagitis: Secondary | ICD-10-CM | POA: Diagnosis not present

## 2022-12-02 DIAGNOSIS — I471 Supraventricular tachycardia, unspecified: Secondary | ICD-10-CM | POA: Diagnosis not present

## 2022-12-02 DIAGNOSIS — F4001 Agoraphobia with panic disorder: Secondary | ICD-10-CM

## 2022-12-02 DIAGNOSIS — Z113 Encounter for screening for infections with a predominantly sexual mode of transmission: Secondary | ICD-10-CM

## 2022-12-02 DIAGNOSIS — Z0000 Encounter for general adult medical examination without abnormal findings: Secondary | ICD-10-CM

## 2022-12-02 MED ORDER — DIAZEPAM 10 MG PO TABS
ORAL_TABLET | ORAL | 3 refills | Status: DC
Start: 2022-12-02 — End: 2023-08-10

## 2022-12-02 NOTE — Progress Notes (Signed)
Complete physical exam   Patient: Bobby [name]   DOB: 07-06-1985   37 y.o. Male  MRN: [id number] Visit Date: 12/02/2022  Today's healthcare provider: Mila Merry, MD   Chief Complaint  Patient presents with   Annual Exam    Would like to request blood work for overall health, STD etc   Medication Refill   Subjective    Discussed the use of AI scribe software for clinical note transcription with the patient, who gave verbal consent to proceed.  History of Present Illness   The patient presents for a routine physical and requested blood work, including an annual STD screening. He reports a recent health screening revealed high LDL levels, prompting dietary modifications. These changes include reducing intake of fatty foods and high cholesterol items, although adherence varies day-to-day.  The patient also experiences discomfort after eating, which he attributes to anxiety, particularly when dining out. To manage this, he takes Valium, often halving the dose, before meals. He also takes Valium in stressful situations but does not use it daily.  In addition to anxiety, the patient has a history of gastric ulcer, which causes persistent soreness. He takes pantoprazole daily for this condition, noting a significant difference if a dose is missed.  The patient uses [facility] pharmacy for his medications. He has noted a slightly elevated blood pressure and has been advised to exercise regularly, limit salt intake, and consider using sea salt as a substitute.      HPI     Annual Exam    Additional comments: Would like to request blood work for [employer]	[place] [facility], STD etc        Comments   Pantoprazole and valium       Last edited by Rolly Salter, CMA on 12/02/2022 10:50 AM.       Past Medical History:  Diagnosis Date   ADHD (attention deficit hyperactivity disorder)    Anxiety    SVT (supraventricular tachycardia)    resolved since ablation   WPW  (Wolff-Parkinson-White syndrome)    resolved since ablation   Past Surgical History:  Procedure Laterality Date   CARDIAC CATHETERIZATION     TONSILLECTOMY  1994   TONSILLECTOMY     Social History   Socioeconomic History   Marital status: Single    Spouse name: Not on file   Number of children: Not on file   Years of education: Not on file   Highest education level: Not on file  Occupational History   Not on file  Tobacco Use   Smoking status: Never   Smokeless tobacco: Never  Vaping Use   Vaping status: Never Used  Substance and Sexual Activity   Alcohol use: No    Alcohol/week: 0.0 standard drinks of alcohol   Drug use: No   Sexual activity: Yes    Birth control/protection: Condom  Other Topics Concern   Not on file  Social History Narrative   Not on file   Social Determinants of Health   Financial Resource Strain: Not on file  Food Insecurity: Not on file  Transportation Needs: Not on file  Physical Activity: Not on file  Stress: Not on file  Social Connections: Not on file  Intimate Partner Violence: Not on file   Family Status  Relation Name Status   Mother  Alive   Father  Alive   MGF  Deceased   PGF  Deceased   Sister  Alive   Brother  Alive  Mat Aunt  Deceased   Sister  Alive   Sister  Alive   Sister  Alive   Brother  Alive   Brother  Alive   Brother  Alive   Brother  Alive   Brother  Deceased  No partnership data on file   Family History  Problem Relation Age of Onset   Breast cancer Mother    Ovarian cancer Mother    Alcohol abuse Father    Drug abuse Father    Heart attack Maternal Grandfather    Heart attack Paternal Grandfather    Depression Brother    No Known Allergies  Patient Care Team: Malva Limes, MD as PCP - General (Family Medicine)   Medications: Outpatient Medications Prior to Visit  Medication Sig   famotidine (PEPCID) 20 MG tablet TAKE 1 TABLET(20 MG) BY MOUTH EVERY EVENING   pantoprazole (PROTONIX) 40 MG  tablet Take 1 tablet (40 mg total) by mouth every morning.   [DISCONTINUED] diazepam (VALIUM) 10 MG tablet TAKE 1/2 TABLET BY MOUTH TWICE DAILY AS NEEDED   No facility-administered medications prior to visit.    Review of Systems    Objective    BP (!) 126/96 (BP Location: Left Arm, Patient Position: Sitting, Cuff Size: Large)   Pulse 92   Ht 5\' 10"  (1.778 m)   Wt 194 lb 4.8 oz (88.1 kg)   SpO2 99%   BMI 27.88 kg/m    Physical Exam  General Appearance:    Well developed, well nourished male. Alert, cooperative, in no acute distress, appears stated age  Head:    Normocephalic, without obvious abnormality, atraumatic  Eyes:    PERRL, conjunctiva/corneas clear, EOM's intact, fundi    benign, both eyes       Ears:    Normal TM's and external ear canals, both ears  Nose:   Nares normal, septum midline, mucosa normal, no drainage   or sinus tenderness  Throat:   Lips, mucosa, and tongue normal; teeth and gums normal  Neck:   Supple, symmetrical, trachea midline, no adenopathy;       thyroid:  No enlargement/tenderness/nodules; no carotid   bruit or JVD  Back:     Symmetric, no curvature, ROM normal, no CVA tenderness  Lungs:     Clear to auscultation bilaterally, respirations unlabored  Chest wall:    No tenderness or deformity  Heart:    Normal heart rate. Normal rhythm. No murmurs, rubs, or gallops.  S1 and S2 normal  Abdomen:     Soft, non-tender, bowel sounds active all four quadrants,    no masses, no organomegaly  Genitalia:    deferred  Rectal:    deferred  Extremities:   All extremities are intact. No cyanosis or edema  Pulses:   2+ and symmetric all extremities  Skin:   Skin color, texture, turgor normal, no rashes or lesions  Lymph nodes:   Cervical, supraclavicular, and axillary nodes normal  Neurologic:   CNII-XII intact. Normal strength, sensation and reflexes      throughout       Last depression screening scores    12/02/2022   10:53 AM 08/11/2021    8:27  AM 04/12/2021    8:11 AM  PHQ 2/9 Scores  PHQ - 2 Score 1 0 0  PHQ- 9 Score 8 1 5    Last fall risk screening    04/12/2021    8:10 AM  Fall Risk   Falls in the past year? 0  Number falls in past yr: 0  Injury with Fall? 0  Risk for fall due to : No Fall Risks  Follow up Falls evaluation completed   Last Audit-C alcohol use screening    04/12/2021    8:10 AM  Alcohol Use Disorder Test (AUDIT)  1. How often do you have a drink containing alcohol? 1  2. How many drinks containing alcohol do you have on a typical day when you are drinking? 0  3. How often do you have six or more drinks on one occasion? 0  AUDIT-C Score 1   A score of 3 or more in women, and 4 or more in men indicates increased risk for alcohol abuse, EXCEPT if all of the points are from question 1   No results found for any visits on 12/02/22.  Assessment & Plan    Routine Health Maintenance and Physical Exam  Exercise Activities and Dietary recommendations  Goals   None     Immunization History  Administered Date(s) Administered   DTaP 09/16/1987, 11/20/1987, 01/21/1988, 11/17/1988, 07/07/1992   HIB (PRP-OMP) 01/17/1989   Hepatitis B 12/14/1998, 01/18/1999, 06/14/1999   IPV 09/16/1987, 11/20/1987, 11/17/1988, 07/07/1992   MMR 11/17/1988, 07/07/1992    Health Maintenance  Topic Date Due   DTaP/Tdap/Td (6 - Tdap) 07/13/1998   Hepatitis C Screening  07/19/2021   INFLUENZA VACCINE  Never done   COVID-19 Vaccine (1 - 2023-24 season) Never done   HIV Screening  Completed   HPV VACCINES  Aged Out    Discussed health benefits of physical activity, and encouraged him to engage in regular exercise appropriate for his age and condition.     Hyperlipidemia Previously elevated LDL. Patient reports attempts to improve diet by reducing fatty foods and high cholesterol foods. -Order lipid panel to assess current LDL levels.  Anxiety Reports taking Valium as needed, particularly in situations that may induce  anxiety such as eating out or stressful situations. -Continue current management with Valium as needed.  GERD Reports taking Pantoprazole daily and experiencing discomfort if missed. -Continue Pantoprazole daily.  Hypertension Blood pressure slightly elevated during visit. Discussed lifestyle modifications including regular exercise, reducing sodium intake, and considering sea salt as an alternative. -Advise patient to continue lifestyle modifications.           Mila Merry, MD  [facility] [phone] (phone) [phone] (fax)  [facility]

## 2022-12-03 LAB — MAGNESIUM: Magnesium: 2.4 mg/dL — ABNORMAL HIGH (ref 1.6–2.3)

## 2022-12-03 LAB — LIPID PANEL
Chol/HDL Ratio: 6.2 {ratio} — ABNORMAL HIGH (ref 0.0–5.0)
Cholesterol, Total: 180 mg/dL (ref 100–199)
HDL: 29 mg/dL — ABNORMAL LOW (ref >39)
LDL Chol Calc (NIH): 104 mg/dL — ABNORMAL HIGH (ref 0–99)
Triglycerides: 273 mg/dL — ABNORMAL HIGH (ref 0–149)
VLDL Cholesterol Cal: 47 mg/dL — ABNORMAL HIGH (ref 5–40)

## 2022-12-03 LAB — CBC
Hematocrit: 50.7 % (ref 37.5–51.0)
Hemoglobin: 16.9 g/dL (ref 13.0–17.7)
MCH: 30.8 pg (ref 26.6–33.0)
MCHC: 33.3 g/dL (ref 31.5–35.7)
MCV: 93 fL (ref 79–97)
Platelets: 349 10*3/uL (ref 150–450)
RBC: 5.48 x10E6/uL (ref 4.14–5.80)
RDW: 12.6 % (ref 11.6–15.4)
WBC: 9.1 10*3/uL (ref 3.4–10.8)

## 2022-12-03 LAB — COMPREHENSIVE METABOLIC PANEL
ALT: 53 [IU]/L — ABNORMAL HIGH (ref 0–44)
AST: 29 [IU]/L (ref 0–40)
Albumin: 4.9 g/dL (ref 4.1–5.1)
Alkaline Phosphatase: 135 [IU]/L — ABNORMAL HIGH (ref 44–121)
BUN/Creatinine Ratio: 12 (ref 9–20)
BUN: 12 mg/dL (ref 6–20)
Bilirubin Total: 1 mg/dL (ref 0.0–1.2)
CO2: 25 mmol/L (ref 20–29)
Calcium: 10.4 mg/dL — ABNORMAL HIGH (ref 8.7–10.2)
Chloride: 100 mmol/L (ref 96–106)
Creatinine, Ser: 1.03 mg/dL (ref 0.76–1.27)
Globulin, Total: 2.4 g/dL (ref 1.5–4.5)
Glucose: 82 mg/dL (ref 70–99)
Potassium: 4.6 mmol/L (ref 3.5–5.2)
Sodium: 141 mmol/L (ref 134–144)
Total Protein: 7.3 g/dL (ref 6.0–8.5)
eGFR: 97 mL/min/{1.73_m2} (ref >59)

## 2022-12-03 LAB — HIV ANTIBODY (ROUTINE TESTING W REFLEX): HIV Screen 4th Generation wRfx: NONREACTIVE

## 2022-12-03 LAB — RPR: RPR Ser Ql: NONREACTIVE

## 2022-12-03 LAB — HEPATITIS C ANTIBODY: Hep C Virus Ab: NONREACTIVE

## 2023-02-08 ENCOUNTER — Other Ambulatory Visit: Payer: Self-pay | Admitting: Family Medicine

## 2023-02-08 DIAGNOSIS — K219 Gastro-esophageal reflux disease without esophagitis: Secondary | ICD-10-CM

## 2023-02-10 NOTE — Telephone Encounter (Signed)
Requested Prescriptions  Pending Prescriptions Disp Refills   pantoprazole (PROTONIX) 40 MG tablet [Pharmacy Med Name: PANTOPRAZOLE 40MG  TABLETS] 90 tablet 0    Sig: TAKE 1 TABLET(40 MG) BY MOUTH EVERY MORNING     Gastroenterology: Proton Pump Inhibitors Passed - 02/08/2023  8:18 AM      Passed - Valid encounter within last 12 months    Recent Outpatient Visits           2 months ago Annual physical exam   [facility] Malva Limes, MD   1 year ago Gastroesophageal reflux disease without esophagitis   [place] [facility] Malva Limes, MD   1 year ago Gastroesophageal reflux disease without esophagitis   [place] [facility] Malva Limes, MD   1 year ago Panic disorder with agoraphobia and severe panic attacks   [place] [facility] Bosie Clos, [facility]   2 years ago Annual physical exam   [facility] Chrismon, Jodell Cipro, [place]

## 2023-05-10 ENCOUNTER — Other Ambulatory Visit: Payer: Self-pay | Admitting: Family Medicine

## 2023-05-10 DIAGNOSIS — K219 Gastro-esophageal reflux disease without esophagitis: Secondary | ICD-10-CM

## 2023-05-11 NOTE — Telephone Encounter (Signed)
 Requested Prescriptions  Pending Prescriptions Disp Refills   pantoprazole (PROTONIX) 40 MG tablet [Pharmacy Med Name: PANTOPRAZOLE 40MG  TABLETS] 90 tablet 0    Sig: TAKE 1 TABLET(40 MG) BY MOUTH EVERY MORNING     Gastroenterology: Proton Pump Inhibitors Passed - 05/11/2023  8:20 AM      Passed - Valid encounter within last 12 months    Recent Outpatient Visits           5 months ago Annual physical exam   [facility] Malva Limes, MD   1 year ago Gastroesophageal reflux disease without esophagitis   [facility] [facility] Malva Limes, MD   1 year ago Gastroesophageal reflux disease without esophagitis   [place] [facility] Malva Limes, MD   2 years ago Panic disorder with agoraphobia and severe panic attacks   [facility] [place]., MD   3 years ago Annual physical exam   [facility] Chrismon, Jodell Cipro, [place]

## 2023-06-21 ENCOUNTER — Encounter: Payer: Self-pay | Admitting: Family Medicine

## 2023-06-28 ENCOUNTER — Telehealth: Payer: Self-pay | Admitting: Family Medicine

## 2023-06-28 NOTE — Telephone Encounter (Signed)
 Has anyone seen these FMLA forms the patient says he dropped off?

## 2023-06-28 NOTE — Telephone Encounter (Signed)
 Patient dropped off FMLA papers for completion.  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   Once completed, please fax to [phone].

## 2023-06-30 DIAGNOSIS — Z0279 Encounter for issue of other medical certificate: Secondary | ICD-10-CM

## 2023-07-05 NOTE — Telephone Encounter (Signed)
 FMLA forms have been completed and sent to medical records to be faxed.

## 2023-07-05 NOTE — Telephone Encounter (Signed)
 Patient is calling in because his job needs his FMLA forms by the end of this week 07/07/23 or he will have to start the process over. He would like a call or message in [place] when ready

## 2023-07-06 NOTE — Telephone Encounter (Signed)
 Forms for FMLA paperwork have been faxed back today 07/06/23 VM

## 2023-07-07 NOTE — Telephone Encounter (Signed)
 I completed the FMLA forms earlier this week. Have they been faxed yet?

## 2023-07-07 NOTE — Telephone Encounter (Signed)
 FMLA forms were completed and sent to medical records to be faxed on 07-05-2023

## 2023-08-10 ENCOUNTER — Other Ambulatory Visit: Payer: Self-pay | Admitting: Family Medicine

## 2023-08-10 DIAGNOSIS — F4001 Agoraphobia with panic disorder: Secondary | ICD-10-CM

## 2023-08-18 ENCOUNTER — Other Ambulatory Visit: Payer: Self-pay | Admitting: Family Medicine

## 2023-08-18 DIAGNOSIS — K219 Gastro-esophageal reflux disease without esophagitis: Secondary | ICD-10-CM

## 2023-12-10 ENCOUNTER — Other Ambulatory Visit: Payer: Self-pay | Admitting: Family Medicine

## 2023-12-10 DIAGNOSIS — K219 Gastro-esophageal reflux disease without esophagitis: Secondary | ICD-10-CM

## 2023-12-11 ENCOUNTER — Other Ambulatory Visit: Payer: Self-pay | Admitting: Family Medicine

## 2023-12-11 DIAGNOSIS — K219 Gastro-esophageal reflux disease without esophagitis: Secondary | ICD-10-CM

## 2023-12-22 ENCOUNTER — Ambulatory Visit (INDEPENDENT_AMBULATORY_CARE_PROVIDER_SITE_OTHER): Admitting: Family Medicine

## 2023-12-22 ENCOUNTER — Encounter: Payer: Self-pay | Admitting: Family Medicine

## 2023-12-22 VITALS — BP 129/89 | HR 75 | Resp 16 | Ht 70.0 in | Wt 192.3 lb

## 2023-12-22 DIAGNOSIS — F4001 Agoraphobia with panic disorder: Secondary | ICD-10-CM

## 2023-12-22 MED ORDER — DIAZEPAM 10 MG PO TABS: ORAL_TABLET | ORAL | 1 refills | Status: AC

## 2023-12-22 NOTE — Progress Notes (Signed)
      Established patient visit   Patient: Bobby [name]   DOB: 11/01/1985   38 y.o. Male  MRN: [id number] Visit Date: 12/22/2023  Today's healthcare provider: Nancyann Perry, MD   Chief Complaint  Patient presents with   Follow-up    FMLA forms  Patient declined influenza vaccine.   Subjective    Discussed the use of AI scribe software for clinical note transcription with the patient, who gave verbal consent to proceed.  History of Present Illness   Bobby [name] is a 38 year old male who presents for FMLA paperwork renewal due to anxiety, agoraphobia, panic attacks, and gastrointestinal symptoms.  He experiences episodes of severe anxiety, often leading to panic attacks, accompanied by stomach cramps. These symptoms result in him missing work approximately three to five days per month.  He takes diazepam  as needed to manage his anxiety, particularly before eating out or traveling long distances. He typically takes half a tablet an hour before eating out due to significant anxiety after eating in public settings. He is currently out of diazepam  and requires a refill.  He suffers from gastroesophageal reflux disease (GERD) and takes pantoprazole  daily. Missing a dose results in indigestion and burning in his stomach. Despite dietary changes that have improved his blood work results, he still relies on pantoprazole . He also uses famotidine  as needed when pantoprazole 's effects wear off.  His recent blood work showed improved cholesterol and triglyceride levels compared to the previous year.       Medications: Outpatient Medications Prior to Visit  Medication Sig   famotidine  (PEPCID ) 20 MG tablet TAKE 1 TABLET(20 MG) BY MOUTH EVERY EVENING   pantoprazole  (PROTONIX ) 40 MG tablet TAKE 1 TABLET(40 MG) BY MOUTH EVERY MORNING   [DISCONTINUED] diazepam  (VALIUM ) 10 MG tablet TAKE 1/2 TABLET BY MOUTH TWICE DAILY AS NEEDED   No facility-administered medications prior to  visit.   Review of Systems  Constitutional:  Negative for appetite change, chills and fever.  Respiratory:  Negative for chest tightness, shortness of breath and wheezing.   Cardiovascular:  Negative for chest pain and palpitations.  Gastrointestinal:  Negative for abdominal pain, nausea and vomiting.       Objective    BP 129/89 (BP Location: Left Arm, Patient Position: Sitting, Cuff Size: Normal)   Pulse 75   Resp 16   Ht 5' 10 (1.778 m)   Wt 192 lb 4.8 oz (87.2 kg)   SpO2 97%   BMI 27.59 kg/m   Physical Exam   General appearance: Well developed, well nourished male, cooperative and in no acute distress Head: Normocephalic, without obvious abnormality, atraumatic Respiratory: Respirations even and unlabored, normal respiratory rate Extremities: All extremities are intact.  Skin: Skin color, texture, turgor normal. No rashes seen  Psych: Appropriate mood and affect. Neurologic: Mental status: Alert, oriented to person, place, and time, thought content appropriate.    Assessment & Plan     1. Panic disorder with agoraphobia and severe panic attacks (Primary) Diazepam  remains effective, but renders him unable to perform his essential job functions. Completed and faxed FMLA to permit 3-5 days out per month due to his condition.  refill diazepam  (VALIUM ) 10 MG tablet; TAKE 1/2 TABLET BY MOUTH TWICE DAILY AS NEEDED  Dispense: 30 tablet; Refill: 1     Nancyann Perry, MD  [facility] [phone] (phone) [phone] (fax)  [facility]

## 2024-01-10 ENCOUNTER — Ambulatory Visit: Admitting: Family Medicine

## 2024-01-16 ENCOUNTER — Other Ambulatory Visit: Payer: Self-pay | Admitting: Family Medicine

## 2024-01-16 DIAGNOSIS — K219 Gastro-esophageal reflux disease without esophagitis: Secondary | ICD-10-CM

## 2024-01-31 ENCOUNTER — Encounter: Payer: Self-pay | Admitting: Family Medicine

## 2024-01-31 MED ORDER — AMOXICILLIN 500 MG PO CAPS: 1000.0000 mg | ORAL_CAPSULE | Freq: Three times a day (TID) | ORAL | 0 refills | Status: AC
